# Patient Record
Sex: Female | Born: 1945 | Race: White | Hispanic: No | Marital: Married | Smoking: Never smoker
Health system: Southern US, Community
[De-identification: ages and names within clinical notes are randomized; demographics above are authoritative.]

## PROBLEM LIST (undated history)

## (undated) DIAGNOSIS — E539 Vitamin B deficiency, unspecified: Secondary | ICD-10-CM

## (undated) DIAGNOSIS — F5101 Primary insomnia: Secondary | ICD-10-CM

## (undated) DIAGNOSIS — M1990 Unspecified osteoarthritis, unspecified site: Secondary | ICD-10-CM

## (undated) DIAGNOSIS — D509 Iron deficiency anemia, unspecified: Secondary | ICD-10-CM

## (undated) DIAGNOSIS — R001 Bradycardia, unspecified: Secondary | ICD-10-CM

## (undated) DIAGNOSIS — F419 Anxiety disorder, unspecified: Secondary | ICD-10-CM

## (undated) DIAGNOSIS — R413 Other amnesia: Secondary | ICD-10-CM

## (undated) DIAGNOSIS — E538 Deficiency of other specified B group vitamins: Secondary | ICD-10-CM

## (undated) DIAGNOSIS — IMO0002 Reserved for concepts with insufficient information to code with codable children: Secondary | ICD-10-CM

## (undated) DIAGNOSIS — E785 Hyperlipidemia, unspecified: Secondary | ICD-10-CM

## (undated) DIAGNOSIS — R002 Palpitations: Secondary | ICD-10-CM

## (undated) DIAGNOSIS — I10 Essential (primary) hypertension: Secondary | ICD-10-CM

## (undated) DIAGNOSIS — R5383 Other fatigue: Secondary | ICD-10-CM

## (undated) DIAGNOSIS — N959 Unspecified menopausal and perimenopausal disorder: Secondary | ICD-10-CM

## (undated) DIAGNOSIS — E782 Mixed hyperlipidemia: Principal | ICD-10-CM

## (undated) DIAGNOSIS — J310 Chronic rhinitis: Secondary | ICD-10-CM

## (undated) DIAGNOSIS — M150 Primary generalized (osteo)arthritis: Secondary | ICD-10-CM

## (undated) DIAGNOSIS — F339 Major depressive disorder, recurrent, unspecified: Secondary | ICD-10-CM

## (undated) DIAGNOSIS — K279 Peptic ulcer, site unspecified, unspecified as acute or chronic, without hemorrhage or perforation: Secondary | ICD-10-CM

## (undated) HISTORY — DX: Bradycardia, unspecified: R00.1

## (undated) HISTORY — PX: KNEE SURGERY: SHX244

## (undated) HISTORY — DX: Major depressive disorder, recurrent, unspecified: F33.9

## (undated) HISTORY — DX: Anxiety disorder, unspecified: F41.9

## (undated) HISTORY — PX: BACK SURGERY: SHX140

## (undated) HISTORY — DX: Primary insomnia: F51.01

## (undated) HISTORY — DX: Essential (primary) hypertension: I10

## (undated) HISTORY — DX: Primary generalized (osteo)arthritis: M15.0

## (undated) HISTORY — DX: Other amnesia: R41.3

## (undated) HISTORY — PX: REPLACEMENT TOTAL KNEE: SUR1224

## (undated) HISTORY — DX: Iron deficiency anemia, unspecified: D50.9

## (undated) HISTORY — DX: Reserved for concepts with insufficient information to code with codable children: IMO0002

## (undated) HISTORY — DX: Chronic rhinitis: J31.0

## (undated) HISTORY — DX: Palpitations: R00.2

## (undated) HISTORY — DX: Unspecified osteoarthritis, unspecified site: M19.90

## (undated) HISTORY — DX: Deficiency of other specified B group vitamins: E53.8

## (undated) HISTORY — DX: Unspecified menopausal and perimenopausal disorder: N95.9

## (undated) HISTORY — DX: Mixed hyperlipidemia: E78.2

## (undated) HISTORY — DX: Vitamin B deficiency, unspecified: E53.9

## (undated) HISTORY — PX: CHOLECYSTECTOMY: SHX55

## (undated) HISTORY — DX: Hyperlipidemia, unspecified: E78.5

## (undated) HISTORY — DX: Peptic ulcer, site unspecified, unspecified as acute or chronic, without hemorrhage or perforation: K27.9

## (undated) HISTORY — DX: Other fatigue: R53.83

---

## 1997-12-14 ENCOUNTER — Other Ambulatory Visit: Admission: RE | Admit: 1997-12-14 | Discharge: 1997-12-14 | Payer: Self-pay | Admitting: Obstetrics and Gynecology

## 2007-10-31 ENCOUNTER — Ambulatory Visit (HOSPITAL_BASED_OUTPATIENT_CLINIC_OR_DEPARTMENT_OTHER): Admission: RE | Admit: 2007-10-31 | Discharge: 2007-10-31 | Payer: Self-pay | Admitting: Orthopedic Surgery

## 2008-01-06 ENCOUNTER — Ambulatory Visit (HOSPITAL_COMMUNITY): Admission: RE | Admit: 2008-01-06 | Discharge: 2008-01-06 | Payer: Self-pay | Admitting: Orthopedic Surgery

## 2008-01-10 ENCOUNTER — Emergency Department (HOSPITAL_COMMUNITY): Admission: EM | Admit: 2008-01-10 | Discharge: 2008-01-10 | Payer: Self-pay | Admitting: Emergency Medicine

## 2008-01-12 ENCOUNTER — Inpatient Hospital Stay (HOSPITAL_COMMUNITY): Admission: EM | Admit: 2008-01-12 | Discharge: 2008-01-13 | Payer: Self-pay | Admitting: Emergency Medicine

## 2009-10-27 IMAGING — CR DG LUMBAR SPINE 2-3V
1 series · 1 of 1 positions shown · non-contrast
Comparison: Lumbar spine MRI 01/06/2008

CLINICAL DATA: Lumbar disc disease.  L5-S1 disc herniation

LUMBAR SPINE - 2-3 VIEW

[view not recorded]
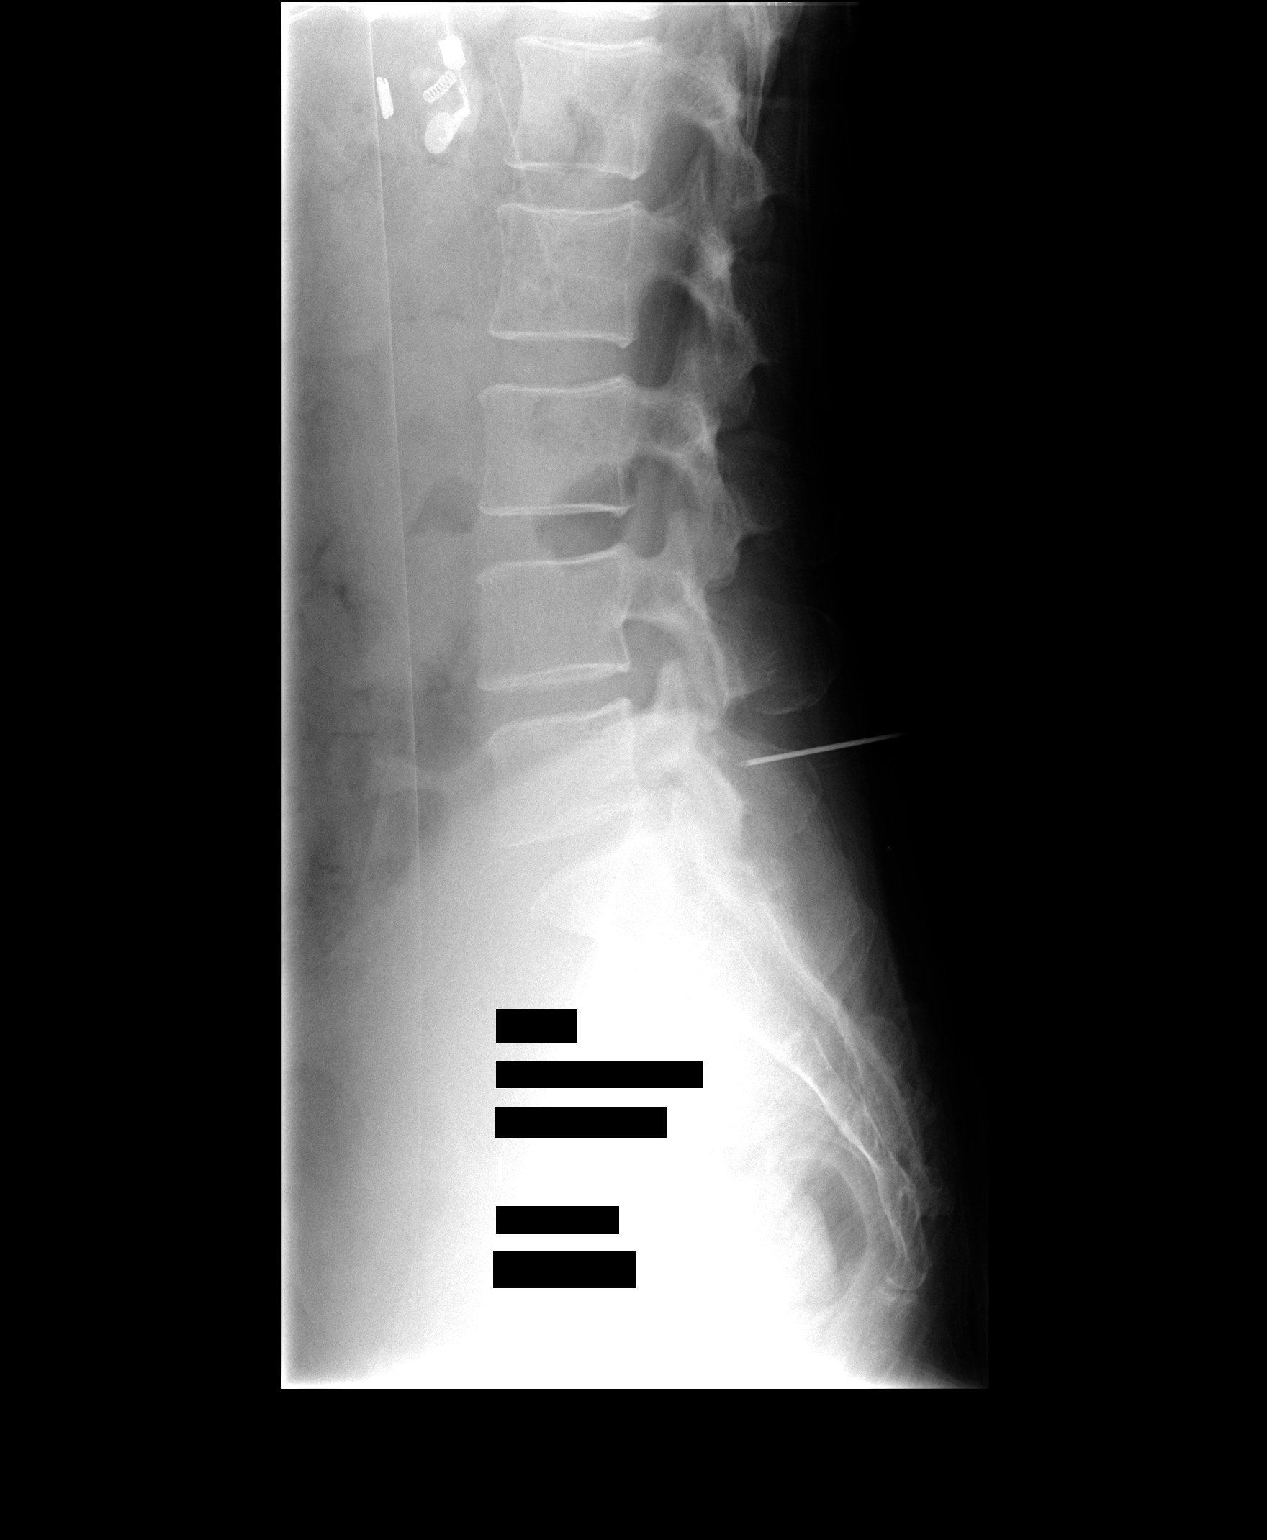

[1 of 1 positions shown; findings below may reference images not displayed]

FINDINGS: The first film from the operating room demonstrates a
spinal needle projecting at the L5-S1 disc space.  The second film
demonstrates retractors and a surgical instrument marking the L5-S1
disc space.
IMPRESSION: 1.  L5-S1 marked intraoperatively.

## 2011-01-06 NOTE — Op Note (Signed)
NAME:  Monique Lane, Monique Lane NO.:  [id number]   MEDICAL RECORD NO.:  [id number]          PATIENT TYPE:  INP   LOCATION:  3005                         FACILITY:  [facility]   PHYSICIAN:  Hewitt Shorts, M.D.DATE OF BIRTH:  1945-12-23   DATE OF PROCEDURE:  01/12/2008  DATE OF DISCHARGE:                               OPERATIVE REPORT   PREOPERATIVE DIAGNOSES:  1. Left L5-S1 lumbar disk herniation.  2. Lumbar degenerative disk disease.  3. Lumbar spondylosis.  4. Lumbar radiculopathy.   POSTOPERATIVE DIAGNOSES:  1. Left L5-S1 lumbar disk herniation.  2. Lumbar degenerative disk disease.  3. Lumbar spondylosis.  4. Lumbar radiculopathy.   PROCEDURE:  Left L5-S1 lumbar laminotomy and microdiskectomy with  microdissection.   SURGEON:  Hewitt Shorts, MD.   ASSISTANT:  Nelia Shi. Webb Silversmith, RNP.   ANESTHESIA:  General endotracheal.   INDICATIONS FOR PROCEDURE:  The patient is a 65 year old woman who  presented with left lumbar radiculopathy.  MRI scan revealed left L4-S1  lumbar disk herniation.  Decision was made to proceed for elective  laminotomy and microdiskectomy.   PROCEDURE:  The patient was brought to the operating room and placed  under general endotracheal anesthesia.  The patient was turned to a  prone position.  Lumbar region was prepped with Betadine soap and  solution and draped in a sterile fashion.  The midline was infiltrated  with local anesthetic with epinephrine and x-ray was taken.  The L5-S1  level was identified and a midline incision was made carried down  through the subcutaneous tissue.  Bipolar electrocautery was used to  maintain hemostasis.  Dissection was carried down to the lumbar fascia,  which was incised on the left side of the midline.  The paraspinal  muscles were dissected from the spinous process and lamina in a  subperiosteal fashion.  A self-retaining retractor was placed in the L5-  S1 intralaminar space was identified.  An  x-ray was taken to confirm  localization and then we proceed with a laminotomy using the X-Max drill  and Kerrison punches.  The ligamentum flavum was carefully removed, and  we exposed the thecal sac and left S1 nerve root.  We identified the  annulus of the L5-S1 disks and the S1 nerve root was pushed laterally,  and we found in the space between the S1 nerve root and the thecal sac  and large free fragment of disk herniation.  This was carefully  dissected from the surrounding epidural tissues and removed as large  fragments.  We then gently retracted the thecal sac and nerve root  medially, and we are able to expose the annulus.  There was a rent in  the inferior aspect of the annulus and residual subligamentous disk  herniation.  Therefore, we incised annulus and continued with thorough  discectomy at the L5-S1 level using a variety of microcurettes and  pituitary rongeurs.  All loose fragments of the disc material were  removed from both the disc space and the epidural space and good  decompression of the thecal sac and nerve root was  achieved.  Once the  decompression was completed and discectomy completed, hemostasis was  established with the use bipolar cautery as well as Gelfoam substance  thrombin, and then we proceeded with closure.  Prior to closure, 2 mL of  fentanyl and 80 mg of Depo-Medrol were instilled into the epidural  space.  The deep fascia was closed with interrupted undyed #1 Vicryl  sutures.  The subcutaneous and subcuticular were closed with inverted 2-  0 undyed Vicryl sutures.  The skin was reapproximated with Dermabond.  The procedure was tolerated well.  The estimated blood loss was 25 mL.  Sponge and needle count were correct.  Following surgery, the patient  was returned back to the supine position, to be reversed from the  anesthetic, extubated, and transferred to the recovery room for further  care.      Hewitt Shorts, M.D.  Electronically  Signed     RWN/MEDQ  D:  01/12/2008  T:  01/13/2008  Job:  [id number]

## 2011-01-06 NOTE — Op Note (Signed)
NAMEMarland Kitchen  KELCEY, Monique NO.:  [id number]   MEDICAL RECORD NO.:  [id number]          PATIENT TYPE:  AMB   LOCATION:  DSC                          FACILITY:  [facility]   PHYSICIAN:  Robert A. Thurston Hole, M.D. DATE OF BIRTH:  [birth year]   DATE OF PROCEDURE:  10/31/2007  DATE OF DISCHARGE:                               OPERATIVE REPORT   PREOPERATIVE DIAGNOSES:  1. 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  2. Right knee lateral patellar tracking.   POSTOPERATIVE DIAGNOSES:  1. 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  2. Right knee lateral patellar tracking.   PROCEDURE:  1. Right knee EUA followed by arthroscopic partial medial and lateral      meniscectomies.  2. Right knee chondroplasty with partial synovectomy.  3. Right knee lateral release.   SURGEON:  Dr. Salvatore Marvel   ANESTHESIA:  Local and MAC.   OPERATIVE TIME:  30 minutes.   COMPLICATIONS:  None.   INDICATIONS FOR PROCEDURE:  Monique Lane is a 65 year old woman who had  previously undergone right knee arthroscopy in September 2008, has had  significant persistent problems over the last 3 months, as she has tried  to return to work with recurrent pain and swelling.  Repeat MRI has  revealed a recurrent meniscal tear with chondromalacia, synovitis, and  possible lateral patellar tracking from scarring.  She has failed  conservative care and is now to undergo arthroscopy.   DESCRIPTION:  Monique Lane was brought to operating room on October 31, 2007,  after a knee block was placed in holding by anesthesia.  She was placed  on the operative table in a supine position.  Her right knee was  examined under anesthesia.  She had full range of motion.  Her knee was  stable.  Ligamentous exam with slight lateral patellar tracking.  She  received Ancef 1 g IV preoperatively for prophylaxis.  Initially,  through an anterolateral portal,  the arthroscope with a pump attached  was placed in through an anteromedial portal and arthroscopic probe was  placed after the knee was prepped using sterile Betadine and draped  using sterile technique.  On the initial inspection of the medial  compartment, she had 30-40% grade 3 chondromalacia which was debrided.  The medial meniscus which had previously undergone a partial  meniscectomy had further tearing of the posterior medial horn, and  another 30-40% was resected back to a stable rim leaving 50% of the  posterior medial horn intact. Intercondylar notch inspected.  Anterior  and posterior cruciate ligaments were normal.  Lateral compartment  showed 25-30% grade 3 chondromalacia which was debrided.  Lateral  meniscus also had a recurrent tear of the anterior and lateral horn of  which 30-40% was resected leaving 50% intact.  The posterior horn was  intact.  Popliteus tendon was intact.  Patellofemoral joint showed 25%  grade 3 chondromalacia which was debrided.  There was slight lateral  patella tilt and tracking.  A  lateral retinacular release was carried  out with an ArthroCare wand with no excessive bleeding.  This  significantly improved patellar tracking and decompressed the  patellofemoral joint.  Moderate synovitis and anterior scarred synovium  then the medial and lateral gutters and anterior compartment was  thoroughly debrided and cauterized as well.  At this point, it was felt  that all pathology had been satisfactorily addressed.  The instruments  were removed.  Portals were closed with 3-0 nylon suture and injected  with 0.25% Marcaine with epinephrine and 4 mg of morphine.  Sterile  dressings were applied and the patient awakened and taken to recovery in  stable condition.   FOLLOW UP CARE:  Monique Lane will be followed as a outpatient on Percocet  and Mobic.  See her back in the office in a week for sutures out and  followup.      Robert A. Thurston Hole, M.D.   Electronically Signed     RAW/MEDQ  D:  10/31/2007  T:  11/01/2007  Job:  [id number]

## 2011-01-06 NOTE — H&P (Signed)
NAME:  Monique Lane, Monique Lane NO.:  [id number]   MEDICAL RECORD NO.:  [id number]          PATIENT TYPE:  INP   LOCATION:  3005                         FACILITY:  [facility]   PHYSICIAN:  Hewitt Shorts, M.D.DATE OF BIRTH:  Jul 06, 1946   DATE OF ADMISSION:  01/12/2008  DATE OF DISCHARGE:                              HISTORY & PHYSICAL   HISTORY OF PRESENT ILLNESS:  The patient is a 65 year old right-handed  white female whom we are asked to see in Neurosurgery consultation in  the [facility] emergency room by Dr. Devoria Albe.  The patient  underwent right knee arthroscopy in September 2008 and March 2009 with  Dr. Salvatore Marvel.  She has undergone physical therapy subsequently and  with this therapy developed increasing pain in her low back extending  down into the left buttock, posterior thigh, and calf to the left heel  with numbness and tingling from the buttock to the spine, leg, and into  the foot.  She describes cramping in the musculature of the left lower  extremity.  Dr. Thurston Hole sent her for a MRI scan, 6 days ago and consulted  Dr. Marikay Alar and saw the patient in neurosurgery consultation.  Since  that time, she has had 3 emergency room visits at [facility]  and one emergency room visit at [facility]  because  of the severity of her left lumbar radicular pain.   The patient had been offered surgery by Dr. Yetta Barre, but she declined it,  however as she tried to manage this with pain medications, muscle  relaxants, and steroids at home.  She developed confusion and  hallucinations.  The patient's family stopped her pain medication, but  she presents to the emergency room today requesting further evaluation  and care.   The patient requested that Dr. Lynelle Doctor obtain a second opinion  neurosurgical consultation and Dr. Lynelle Doctor contacted [place] for such.  MRI  scan from 6 days ago shows multilevel degenerative disk disease,  spondylosis to  the lumbar spine with spine like disk protrusion in the  lateral recess and neuroforamen and extraforaminal space to the right-  sided S4 and L4-L5 that appears spondylitic.  However, the most acute  finding appears to be a moderately large left L5-S1 lumbar disk  herniation.  It was felt that this is a symptomatic lesion.   PAST MEDICAL HISTORY:  Notable for history of bradycardia, which has  been monitored, but has not required treatment.  She does not describe  any history of hypertension, myocardial infarction, cancer, stroke,  diabetes, peptic ulcers, or lung disease.   PAST SURGICAL HISTORY:  Previous surgery with hysterectomy 20 years ago,  appendectomy when she was 65 years old, cholecystectomy over 10 years  ago, hemorrhoidectomy over 15 years ago and her 2 right knee  arthroscopies as described above.   ALLERGIES:  She reports an allergy to Demerol.   MEDICATIONS:  Alesse, fish oil and recently p.o. morphine, prednisone  Dosepak and Valium.   FAMILY HISTORY:  Parents have passed out and mother had a myocardial  infarction and father had liver cancer.   SOCIAL HISTORY:  The patient is married.  She works at [employer].  She has 1 daughter who is alive and well.  She does not  smoke, drink alcoholic beverages or have history of substance abuse.   REVIEW OF SYSTEMS:  Notable for as described in the  history of present  illness, past medical history, but a 14-point review of system is  otherwise remarkable.   PHYSICAL EXAMINATION:  GENERAL:  The patient is a well-developed and  well-nourished white female in no acute distress.  VITAL SIGNS:  Temperature 96.6, pulse 59, blood pressure 106/58,  respiratory rate 20.  LUNGS:  Clear to auscultation.  She has symmetrical respiratory  excursion.  HEART:  Regular rate and rhythm.  Normal S1 and S2.  There is no murmur.  EXTREMITIES:  No clubbing, cyanosis, or edema.  MUSCULOSKELETAL:  Positive straight leg raising on  the left.  Negative  straight leg raising on the right.  NEUROLOGICAL:  Shows 5/5 strength to the upper and lower extremities  including the deltoids, biceps, triceps, intrinsic grip, iliopsoas,  quadriceps, dorsiflexion, extensor as well as plantar flexion  bilaterally.  Sensation is intact to pinprick to the upper and lower  extremities.  Reflexes are minimal to biceps, brachialis, triceps,  quadriceps, and gastrocnemius and symmetrical bilaterally.  Toes are  downgoing bilaterally.  She has a normal gait and stance.   DIAGNOSIS:  MRI of the lumbar spine done on Jan 06, 2008, shows  disk  herniation to the right within the lateral recess and neuroforaminal and  extraforaminal space at L3-L4 and L4-L5.  However, the most significant  finding is of an acute left L5-S1 lumbar disk herniation.  The disk  fragment has migrated slightly probably behind the body of S1.   IMPRESSION:  Acute left lumbar radiculopathy, which unfortunately has  not responded to rest  and medication over the past several days.  The  patient is neurologically intact and has not tolerated medications well  with some confusion and hallucinations with the medications.   PLAN:  The patient readmitted at her request and will be taken to  surgery for a left L4-S1 lumbar laminectomy and microdiskectomy.  We  have offered to have her follow up with Dr. Marikay Alar, who had  initially assessed and treated her however, she asked that we assume her  care.   I discussed the nature of the surgery, procedure, and type of the  surgery, hospital stay, and  occupational limitations postoperatively,  and risks that include risks of infection, bleeding, possibility of  transfusion, of the risk of nerve root dysfunction, pain, weakness,  numbness, or paresthesias, the risks of dural tear, CSF leakage,  possible need for further surgery, risk of recurrence of disk  herniation,  possible for further surgery, anesthetic risks of   myocardial infarction, stroke, and death.  After discussing all of this,  she would like to go ahead with surgery and is admitted for such.      Hewitt Shorts, M.D.  Electronically Signed     RWN/MEDQ  D:  01/12/2008  T:  01/13/2008  Job:  [id number]

## 2011-05-18 LAB — POCT HEMOGLOBIN-HEMACUE: Hemoglobin: 13.4

## 2011-05-20 LAB — COMPREHENSIVE METABOLIC PANEL
ALT: 83 — ABNORMAL HIGH
AST: 36
Albumin: 3.3 — ABNORMAL LOW
Alkaline Phosphatase: 119 — ABNORMAL HIGH
BUN: 22
CO2: 27
Calcium: 9.2
Chloride: 103
Creatinine, Ser: 0.75
GFR calc Af Amer: >60
GFR calc non Af Amer: >60
Glucose, Bld: 68 — ABNORMAL LOW
Potassium: 4.2
Sodium: 138
Total Bilirubin: 1.9 — ABNORMAL HIGH
Total Protein: 6.2

## 2011-05-20 LAB — CBC
HCT: 42
Hemoglobin: 14
Hemoglobin: 14.2
MCHC: 33.8
MCHC: 34.2
MCV: 90.3
Platelets: 268
Platelets: 311
RBC: 4.65
RDW: 12.8
RDW: 13.4
WBC: 9.5

## 2011-05-20 LAB — DIFFERENTIAL
Basophils Absolute: 0
Basophils Absolute: 0
Basophils Relative: 0
Basophils Relative: 0
Eosinophils Absolute: 0.1
Eosinophils Relative: 0
Eosinophils Relative: 1
Lymphocytes Relative: 36
Lymphs Abs: 3.4
Monocytes Absolute: 0.2
Monocytes Absolute: 1.2 — ABNORMAL HIGH
Monocytes Relative: 12
Neutro Abs: 4.8
Neutro Abs: 8.5 — ABNORMAL HIGH
Neutrophils Relative %: 50

## 2011-05-20 LAB — BASIC METABOLIC PANEL
BUN: 18
CO2: 28
Calcium: 9
Glucose, Bld: 139 — ABNORMAL HIGH
Sodium: 137

## 2013-10-03 ENCOUNTER — Encounter: Payer: Self-pay | Admitting: Podiatrist

## 2013-10-03 ENCOUNTER — Ambulatory Visit (INDEPENDENT_AMBULATORY_CARE_PROVIDER_SITE_OTHER): Payer: Medicare Other | Admitting: Podiatrist

## 2013-10-03 ENCOUNTER — Ambulatory Visit (INDEPENDENT_AMBULATORY_CARE_PROVIDER_SITE_OTHER): Payer: Medicare Other

## 2013-10-03 DIAGNOSIS — M79609 Pain in unspecified limb: Secondary | ICD-10-CM

## 2013-10-03 DIAGNOSIS — M722 Plantar fascial fibromatosis: Secondary | ICD-10-CM

## 2013-10-03 MED ORDER — TRIAMCINOLONE ACETONIDE 40 MG/ML IJ SUSP
20.0000 mg | Freq: Once | INTRAMUSCULAR | Status: AC
  Administered 2013-10-03: 20 mg

## 2013-10-03 NOTE — Progress Notes (Signed)
   Subjective:    Patient ID: Monique Lane, female    DOB: [birth year], 68 y.o.   MRN: [id number]  HPI patient presents for left heel pain stating " I called three weeks ago to make an appt to see about my left heel on the bottom and used excedrin cream and I used it every night with a sock at night and it feels better and sore and tender and doesn't hurt too bad today"  She states it has improved with conservative care however it is still a little sensitive to touch and stand.      Review of Systems  Gastrointestinal:       Bleeding ulcers  All other systems reviewed and are negative.       Objective:   Physical Exam GENERAL APPEARANCE: Alert, conversant. Appropriately groomed. No acute distress.  VASCULAR: Pedal pulses palpable at 2/4 DP and PT bilateral.  Capillary refill time is immediate to all digits,  Proximal to distal cooling it warm to warm.  Digital hair growth is present bilateral  NEUROLOGIC: sensation is intact epicritically and protectively to 5.07 monofilament at 5/5 sites bilateral.  Light touch is intact bilateral, vibratory sensation intact bilateral, achilles tendon reflex is intact bilateral.  MUSCULOSKELETAL: pain on palpation with palpable inflammation at the insertion of the plantar fascia on the medial calcaneal tubercle  Left.  Plantar fascia is intact.  Negative tinel sign noted. DERMATOLOGIC: skin color, texture, and turger are within normal limits.  No preulcerative lesions are seen, no interdigital maceration noted.  No open lesions present.  Digital nails are asymptomatic.     Assessment & Plan:  Plantar fasciitis/ bursitis left Plan:  Discussed etiology, pathology, conservative vs. Surgical therapies and at this time a plantar fascial injection was recommended.  The patient agreed and a sterile skin prep was applied.  An injection consisting of kenalog and marcaine mixture was infiltrated at the point of maximal tenderness on the left Heel.  The patient tolerated  this well and was given instructions for aftercare. A  plantar fascia taping was applied.  She will call if the symptoms do not fully resolve with the injection.  May be a candidate for custom orthotics in the future.

## 2013-12-21 ENCOUNTER — Ambulatory Visit (INDEPENDENT_AMBULATORY_CARE_PROVIDER_SITE_OTHER): Payer: Medicare Other

## 2013-12-21 ENCOUNTER — Encounter: Payer: Self-pay | Admitting: *Deleted

## 2013-12-21 VITALS — BP 144/76 | HR 52 | Resp 18

## 2013-12-21 DIAGNOSIS — M722 Plantar fascial fibromatosis: Secondary | ICD-10-CM

## 2013-12-21 DIAGNOSIS — M79609 Pain in unspecified limb: Secondary | ICD-10-CM

## 2013-12-21 NOTE — Progress Notes (Signed)
   Subjective:    Patient ID: Monique Lane, female    DOB: 01-17-1946, 68 y.o.   MRN: [id number]  HPI my left heel is tender and I started working at [employer] and they told me that I had to get the shoes out of a book and it hurt on the 5th toe on right foot     Review of Systems  Hematological: Bruises/bleeds easily.  All other systems reviewed and are negative.      Objective:   Physical Exam Lower extremity objective findings as follows pedal pulses palpable DP and PT posterior were for bilateral capillary refill time 3 seconds all digits epicritic and proprioceptive sensations intact bilateral patient does have rectus foot type some prominence of the fifth metatarsal and fifth toe was aggravated by the end curved last shoe that she was being made aware at this time easily wider straight lash shoes that she currently has available to her will maintain a none slip shoe Oxford type shoe a wider design accommodates her foot. The shoe also accommodate a functional orthoses which patient will be getting in the future. Patient does have pain on palpation mid band of the plantar fascial left medial calcaneal tubercle area no signs of fracture noted no other osseous abnormalities recalcitrant plantar fasciitis is noted       Assessment & Plan:  Assessment capsulitis fifth MTP area right resolving with shoe change also at this time plantar fasciitis left foot fascial strapping applied today assess the benefits of functional orthoses in the future. Maintain a good stable Oxford type shoe or sit or sandal at all times no barefoot or flimsy shoes or flip-flops recommended Tylenol as needed for pain also use ice for the heel if needed next  [employer] Pacificichard Aivan Fillingim DPM

## 2013-12-21 NOTE — Patient Instructions (Signed)

## 2014-01-04 ENCOUNTER — Ambulatory Visit (INDEPENDENT_AMBULATORY_CARE_PROVIDER_SITE_OTHER): Payer: Medicare Other

## 2014-01-04 VITALS — BP 140/77 | HR 46 | Resp 18

## 2014-01-04 DIAGNOSIS — M79609 Pain in unspecified limb: Secondary | ICD-10-CM

## 2014-01-04 DIAGNOSIS — M722 Plantar fascial fibromatosis: Secondary | ICD-10-CM

## 2014-01-04 MED ORDER — TRIAMCINOLONE ACETONIDE 10 MG/ML IJ SUSP: 10.0000 mg | Freq: Once | INTRAMUSCULAR | Status: DC

## 2014-01-04 NOTE — Patient Instructions (Signed)

## 2014-01-04 NOTE — Progress Notes (Signed)
   Subjective:    Patient ID: Monique Lane, female    DOB: 1946/07/29, 68 y.o.   MRN: [id number]  HPI my left heel is still hurting but it is better than it was and I want him to give me a shot    Review of Systems no new systemic changes or findings noted    Objective:   Physical Exam Lower extremity objective findings intact and unchanged patient hasn't reduced are almost no pain in the arch area after the strapping however still some slight tenderness or pain in the inferior calcaneal tubercle there is requesting a steroid injection at this time. Steroid injection is delivered at this time to the inferior heel area at this time based affected the taping was beneficial orthotic would be beneficial dispensed a power step type orthotic for patient use with oral and written instructions .       Assessment & Plan:  Assessment plantar fasciitis/heel spur syndrome left foot responding to the strapping power step orthotics dispensed again form is reviewed also at this time for root reappointed in one to 2 months for followup orthotic adjustments if needed patient is also given injection per patient request of tender with Kenalog 20 mg Xylocaine plain to the inferior left heel maintain ice as needed and reappointed one to 2 months for adjustments to orthotics.  Alvan Dameichard Alayia Meggison DPM

## 2014-08-31 ENCOUNTER — Ambulatory Visit (INDEPENDENT_AMBULATORY_CARE_PROVIDER_SITE_OTHER): Payer: Medicare Other

## 2014-08-31 VITALS — BP 132/75 | HR 58 | Resp 18

## 2014-08-31 DIAGNOSIS — M79672 Pain in left foot: Secondary | ICD-10-CM

## 2014-08-31 DIAGNOSIS — M722 Plantar fascial fibromatosis: Secondary | ICD-10-CM | POA: Diagnosis not present

## 2014-08-31 MED ORDER — TRIAMCINOLONE ACETONIDE 10 MG/ML IJ SUSP: 10.0000 mg | Freq: Once | INTRAMUSCULAR | Status: DC

## 2014-08-31 NOTE — Patient Instructions (Signed)
Bring orthotics at next visit for possible adjustment and reevaluation   WEARING INSTRUCTIONS FOR ORTHOTICS  Don't expect to be comfortable wearing your orthotic devices for the first time.  Like eyeglasses, you may be aware of them as time passes, they will not be uncomfortable and you will enjoy wearing them.  FOLLOW THESE INSTRUCTIONS EXACTLY!  1. Wear your orthotic devices for:       Not more than 1 hour the first day.       Not more than 2 hours the second day.       Not more than 3 hours the third day and so on.        Or wear them for as long as they feel comfortable.       If you experience discomfort in your feet or legs take them out.  When feet & legs feel       better, put them back in.  You do need to be consistent and wear them a little        everyday. 2.   If at any time the orthotic devices become acutely uncomfortable before the       time for that particular day, STOP WEARING THEM. 3.   On the next day, do not increase the wearing time. 4.   Subsequently, increase the wearing time by 15-30 minutes only if comfortable to do       so. 5.   You will be seen by your doctor about 2-4 weeks after you receive your orthotic       devices, at which time you will probably be wearing your devices comfortably        for about 8 hours or more a day. 6.   Some patients occasionally report mild aches or discomfort in other parts of the of       body such as the knees, hips or back after 3 or 4 consecutive hours of wear.  If this       is the case with you, do not extend your wearing time.  Instead, cut it back an hour or       two.  In all likelihood, these symptoms will disappear in a short period of time as your       body posture realigns itself and functions more efficiently. 7.   It is possible that your orthotic device may require some small changes or adjustment       to improve their function or make them more comfortable.   This is usually not done       before one to  three months have elapsed.  These adjustments are made in        accordance with the changed position your feet are assuming as a result of       improved biomechanical function. 8.   In women's shoes, it's not unusual for your heel to slip out of the shoe, particularly if       they are step-in-shoes.  If this is the case, try other shoes or other styles.  Try to       purchase shoes which have deeper heal seats or higher heel counters. 9.   Squeaking of orthotics devices in the shoes is due to the movement of the devices       when they are functioning normally.  To eliminate squeaking, simply dust some       baby powder into your shoes before inserting  the devices.  If this does not work,        apply soap or wax to the edges of the orthotic devices or put a tissue into the shoes. 10. It is important that you follow these directions explicitly.  Failure to do so will simply       prolong the adjustment period or create problems which are easily avoided.  It makes       no difference if you are wearing your orthotic devices for only a few hours after        several months, so long as you are wearing them comfortably for those hours. 11. If you have any questions or complaints, contact our office.  We have no way of       knowing about your problems unless you tell us.  If we do not hear from you, we will       assume that you are proceeding well.     ICE INSTRUCTIONS  Apply ice or cold pack to the affected area at least 3 times a day for 10-15 minutes each time.  You should also use ice after prolonged activity or vigorous exercise.  Do not apply ice longer than 20 minutes at one time.  Always keep a cloth between your skin and the ice pack to prevent burns.  Being consistent and following these instructions will help control your symptoms.  We suggest you purchase a gel ice pack because they are reusable and do bit leak.  Some of them are designed to wrap around the area.  Use the method  that works best for you.  Here are some other suggestions for icing.   Use a frozen bag of peas or corn-inexpensive and molds well to your body, usually stays frozen for 10 to 20 minutes.  Wet a towel with cold water and squeeze out the excess until it's damp.  Place in a bag in the freezer for 20 minutes. Then remove and use.

## 2014-08-31 NOTE — Progress Notes (Signed)
   Subjective:    Patient ID: Monique Lane, female    DOB: June 01, 1946, 69 y.o.   MRN: [id number]  HPI  I HAVE SOME LEFT HEEL PAIN AND THAT HAS BEEN GOING ON FOR ABOUT A WEEK NOW AND IS TENDER AND HURTS IN THE MORNING AND HURTS ON THE BOTTOM OF MY LEFT HEEL  Review of Systems no new findings or systemic changes noted     Objective:   Physical Exam Neurovascular status is intact and unchanged patient's had a recurrence of her heel pain for the past week or so isn't wearing orthotics as consistent with C7 will bring those in at future visits for follow-up and adjustments may be more beneficial with padded top covering. Patient continues to have pain on palpation medial band plantar fascia medial calcaneal tubercle no history of injury or trauma no other changes noted on palpation is identified orthopedic biomechanical exam otherwise unremarkable rectus foot type ankle metatarsal subtalar joint motions normal no retrocalcaneal pain no Achilles pain. No open wounds no ulcers.       Assessment & Plan:  Assessment plantar fasciitis/heel spur re-exacerbation left foot plan at this time fascial strapping applied to the left foot after an injection tender with Kenalog 20 10 Xylocaine plain is get delivered to the medial band medial calcaneal tubercle area of the left heel. However the injection well will also continue with ice to the area as instructed maintain orthoses as instructed may bring in orthotics for adjustments. Next  Alvan Dameichard Dealie Koelzer DPM

## 2014-09-14 ENCOUNTER — Ambulatory Visit: Payer: Medicare Other

## 2015-04-18 DIAGNOSIS — F419 Anxiety disorder, unspecified: Secondary | ICD-10-CM

## 2015-04-18 DIAGNOSIS — I10 Essential (primary) hypertension: Secondary | ICD-10-CM

## 2015-04-18 DIAGNOSIS — R001 Bradycardia, unspecified: Secondary | ICD-10-CM

## 2015-04-18 DIAGNOSIS — E785 Hyperlipidemia, unspecified: Secondary | ICD-10-CM

## 2015-04-18 HISTORY — DX: Essential (primary) hypertension: I10

## 2015-04-18 HISTORY — DX: Bradycardia, unspecified: R00.1

## 2015-04-18 HISTORY — DX: Anxiety disorder, unspecified: F41.9

## 2015-04-18 HISTORY — DX: Hyperlipidemia, unspecified: E78.5

## 2015-09-04 DIAGNOSIS — D509 Iron deficiency anemia, unspecified: Secondary | ICD-10-CM

## 2015-09-04 DIAGNOSIS — R5383 Other fatigue: Secondary | ICD-10-CM | POA: Insufficient documentation

## 2015-09-04 DIAGNOSIS — E539 Vitamin B deficiency, unspecified: Secondary | ICD-10-CM

## 2015-09-04 DIAGNOSIS — N959 Unspecified menopausal and perimenopausal disorder: Secondary | ICD-10-CM

## 2015-09-04 DIAGNOSIS — F339 Major depressive disorder, recurrent, unspecified: Secondary | ICD-10-CM | POA: Insufficient documentation

## 2015-09-04 HISTORY — DX: Iron deficiency anemia, unspecified: D50.9

## 2015-09-04 HISTORY — DX: Other fatigue: R53.83

## 2015-09-04 HISTORY — DX: Vitamin B deficiency, unspecified: E53.9

## 2015-09-04 HISTORY — DX: Unspecified menopausal and perimenopausal disorder: N95.9

## 2015-09-04 HISTORY — DX: Major depressive disorder, recurrent, unspecified: F33.9

## 2015-09-24 DIAGNOSIS — E782 Mixed hyperlipidemia: Secondary | ICD-10-CM | POA: Insufficient documentation

## 2015-09-24 DIAGNOSIS — J310 Chronic rhinitis: Secondary | ICD-10-CM

## 2015-09-24 DIAGNOSIS — E538 Deficiency of other specified B group vitamins: Secondary | ICD-10-CM | POA: Insufficient documentation

## 2015-09-24 HISTORY — DX: Chronic rhinitis: J31.0

## 2015-09-24 HISTORY — DX: Mixed hyperlipidemia: E78.2

## 2015-09-24 HISTORY — DX: Deficiency of other specified B group vitamins: E53.8

## 2015-10-25 DIAGNOSIS — K279 Peptic ulcer, site unspecified, unspecified as acute or chronic, without hemorrhage or perforation: Secondary | ICD-10-CM | POA: Insufficient documentation

## 2015-10-25 DIAGNOSIS — Z8711 Personal history of peptic ulcer disease: Secondary | ICD-10-CM | POA: Insufficient documentation

## 2015-10-25 DIAGNOSIS — R002 Palpitations: Secondary | ICD-10-CM

## 2015-10-25 DIAGNOSIS — F5101 Primary insomnia: Secondary | ICD-10-CM

## 2015-10-25 HISTORY — DX: Primary insomnia: F51.01

## 2015-10-25 HISTORY — DX: Palpitations: R00.2

## 2015-10-25 HISTORY — DX: Peptic ulcer, site unspecified, unspecified as acute or chronic, without hemorrhage or perforation: K27.9

## 2016-03-03 DIAGNOSIS — M159 Polyosteoarthritis, unspecified: Secondary | ICD-10-CM

## 2016-03-03 DIAGNOSIS — M150 Primary generalized (osteo)arthritis: Secondary | ICD-10-CM

## 2016-03-03 DIAGNOSIS — M8949 Other hypertrophic osteoarthropathy, multiple sites: Secondary | ICD-10-CM

## 2016-03-03 HISTORY — DX: Polyosteoarthritis, unspecified: M15.9

## 2016-03-03 HISTORY — DX: Other hypertrophic osteoarthropathy, multiple sites: M89.49

## 2016-03-16 DIAGNOSIS — R413 Other amnesia: Secondary | ICD-10-CM

## 2016-03-16 HISTORY — DX: Other amnesia: R41.3

## 2016-06-19 ENCOUNTER — Ambulatory Visit (INDEPENDENT_AMBULATORY_CARE_PROVIDER_SITE_OTHER): Payer: Medicare Other | Admitting: Sports Medicine

## 2016-06-19 DIAGNOSIS — M79672 Pain in left foot: Secondary | ICD-10-CM

## 2016-06-19 DIAGNOSIS — M722 Plantar fascial fibromatosis: Secondary | ICD-10-CM

## 2016-06-19 MED ORDER — IBUPROFEN 600 MG PO TABS: 600.0000 mg | ORAL_TABLET | Freq: Every day | ORAL | 0 refills | Status: DC

## 2016-06-19 NOTE — Progress Notes (Signed)
Subjective: Monique Lane is a 70 y.o. female patient presents to office with complaint of heel pain on the left. Patient admits to post static dyskinesia for 2-3 weeks in duration. Patient has treated this problem with soaking and changing shoes and massage with lotions and creams with some improvement. Admits to a past history of plantar fasciitis 1 year ago with similar symptoms. States that her pain is not bad like it was before. However, she felt a little discomfort and decided to come to have it evaluated before it got worse. Patient states that she does not want an injection. Denies any other pedal complaints.   There are no active problems to display for this patient.   Current Outpatient Prescriptions on File Prior to Visit  Medication Sig Dispense Refill  . azithromycin (ZITHROMAX) 250 MG tablet     . cefdinir (OMNICEF) 300 MG capsule     . loratadine (CLARITIN) 10 MG tablet Take 10 mg by mouth daily.  0  . omeprazole (PRILOSEC) 20 MG capsule     . pravastatin (PRAVACHOL) 40 MG tablet     . predniSONE (DELTASONE) 20 MG tablet Take 20 mg by mouth daily.  0  . simvastatin (ZOCOR) 20 MG tablet      Current Facility-Administered Medications on File Prior to Visit  Medication Dose Route Frequency Provider Last Rate Last Dose  . triamcinolone acetonide (KENALOG) 10 MG/ML injection 10 mg  10 mg Other Once Alvan Dame, DPM      . triamcinolone acetonide (KENALOG) 10 MG/ML injection 10 mg  10 mg Other Once Alvan Dame, DPM        Allergies  Allergen Reactions  . Demerol [Meperidine] Nausea And Vomiting and Rash    Objective: Physical Exam General: The patient is alert and oriented x3 in no acute distress.  Dermatology: Skin is warm, dry and supple bilateral lower extremities. Nails 1-10 are normal. There is no erythema, edema, no eccymosis, no open lesions present. Integument is otherwise unremarkable.  Vascular: Dorsalis Pedis pulse and Posterior Tibial pulse are 1/4 bilateral.  Capillary fill time is immediate to all digits.  Neurological: Grossly intact to light touch with an achilles reflex of +2/5 and a  negative Tinel's sign bilateral.  Musculoskeletal: No reproducible tenderness to palpation at the medial calcaneal tubercale and through the insertion of the plantar fascia on the left foot, however, occasional subjective discomfort. No pain with compression of calcaneus bilateral. No pain with tuning fork to calcaneus bilateral. No pain with calf compression bilateral. There is decreased Ankle joint range of motion bilateral. All other joints range of motion within normal limits bilateral. Strength 5/5 in all groups bilateral.   Assessment and Plan: Problem List Items Addressed This Visit    None    Visit Diagnoses    Plantar fasciitis of left foot    -  Primary   Relevant Medications   ibuprofen (ADVIL,MOTRIN) 600 MG tablet   Left foot pain       Relevant Medications   ibuprofen (ADVIL,MOTRIN) 600 MG tablet      -Complete examination performed.  -Previous Xrays reviewed -Discussed with patient in detail the condition of plantar fasciitis, how this occurs and general treatment options. Explained both conservative and surgical treatments.  -Patient declined injection -Rx Motrin -Recommended good supportive shoes and advised use of  custom molded orthoses of which she owns - Explained in detail the use of the fascial brace/left foot which was dispensed at today's visit. -Explained and dispensed to  patient daily stretching exercises. -Recommend patient to ice affected area 1-2x daily. -Patient to return to office in as needed for follow up or sooner if problems or questions arise.  Asencion Islamitorya Jakari Jacot, DPM

## 2016-06-19 NOTE — Patient Instructions (Signed)

## 2017-01-26 DIAGNOSIS — R001 Bradycardia, unspecified: Secondary | ICD-10-CM | POA: Diagnosis not present

## 2017-01-26 DIAGNOSIS — E785 Hyperlipidemia, unspecified: Secondary | ICD-10-CM | POA: Diagnosis not present

## 2017-01-26 DIAGNOSIS — K219 Gastro-esophageal reflux disease without esophagitis: Secondary | ICD-10-CM | POA: Diagnosis not present

## 2017-01-26 DIAGNOSIS — F0390 Unspecified dementia without behavioral disturbance: Secondary | ICD-10-CM | POA: Diagnosis not present

## 2017-01-26 DIAGNOSIS — I10 Essential (primary) hypertension: Secondary | ICD-10-CM | POA: Diagnosis not present

## 2017-01-26 DIAGNOSIS — M1991 Primary osteoarthritis, unspecified site: Secondary | ICD-10-CM | POA: Diagnosis not present

## 2017-01-26 DIAGNOSIS — R079 Chest pain, unspecified: Secondary | ICD-10-CM | POA: Diagnosis not present

## 2017-01-27 DIAGNOSIS — I10 Essential (primary) hypertension: Secondary | ICD-10-CM | POA: Diagnosis not present

## 2017-01-27 DIAGNOSIS — E785 Hyperlipidemia, unspecified: Secondary | ICD-10-CM | POA: Diagnosis not present

## 2017-01-27 DIAGNOSIS — M1991 Primary osteoarthritis, unspecified site: Secondary | ICD-10-CM | POA: Diagnosis not present

## 2017-01-27 DIAGNOSIS — R079 Chest pain, unspecified: Secondary | ICD-10-CM | POA: Diagnosis not present

## 2017-01-27 DIAGNOSIS — K219 Gastro-esophageal reflux disease without esophagitis: Secondary | ICD-10-CM | POA: Diagnosis not present

## 2017-01-27 DIAGNOSIS — F0390 Unspecified dementia without behavioral disturbance: Secondary | ICD-10-CM | POA: Diagnosis not present

## 2017-09-13 ENCOUNTER — Ambulatory Visit (INDEPENDENT_AMBULATORY_CARE_PROVIDER_SITE_OTHER): Payer: Medicare Other | Admitting: Cardiology

## 2017-09-13 ENCOUNTER — Encounter: Payer: Self-pay | Admitting: Cardiology

## 2017-09-13 VITALS — BP 140/80 | HR 71 | Ht 67.0 in | Wt 140.0 lb

## 2017-09-13 DIAGNOSIS — I10 Essential (primary) hypertension: Secondary | ICD-10-CM

## 2017-09-13 DIAGNOSIS — E782 Mixed hyperlipidemia: Secondary | ICD-10-CM | POA: Diagnosis not present

## 2017-09-13 DIAGNOSIS — R001 Bradycardia, unspecified: Secondary | ICD-10-CM | POA: Diagnosis not present

## 2017-09-13 NOTE — Patient Instructions (Addendum)
Medication Instructions:  Your physician recommends that you continue on your current medications as directed. Please refer to the Current Medication list given to you today.  Labwork: Your physician recommends that you have lab work today: Lipid panel  Testing/Procedures: EKG Today  Follow-Up: Your physician recommends that you schedule a follow-up appointment in: 6 months with Dr. Bing Matter in [place]  Any Other Special Instructions Will Be Listed Below (If Applicable).     If you need a refill on your cardiac medications before your next appointment, please call your pharmacy.   Cholesterol Cholesterol is a white, waxy, fat-like substance that is needed by the human body in small amounts. The liver makes all the cholesterol we need. Cholesterol is carried from the liver by the blood through the blood vessels. Deposits of cholesterol (plaques) may build up on blood vessel (artery) walls. Plaques make the arteries narrower and stiffer. Cholesterol plaques increase the risk for heart attack and stroke. You cannot feel your cholesterol level even if it is very high. The only way to know that it is high is to have a blood test. Once you know your cholesterol levels, you should keep a record of the test results. Work with your health care provider to keep your levels in the desired range. What do the results mean?  Total cholesterol is a rough measure of all the cholesterol in your blood.  LDL (low-density lipoprotein) is the "bad" cholesterol. This is the type that causes plaque to build up on the artery walls. You want this level to be low.  HDL (high-density lipoprotein) is the "good" cholesterol because it cleans the arteries and carries the LDL away. You want this level to be high.  Triglycerides are fat that the body can either burn for energy or store. High levels are closely linked to heart disease. What are the desired levels of cholesterol?  Total cholesterol below  200.  LDL below 100 for people who are at risk, below 70 for people at very high risk.  HDL above 40 is good. A level of 60 or higher is considered to be protective against heart disease.  Triglycerides below 150. How can I lower my cholesterol? Diet Follow your diet program as told by your health care provider.  Choose fish or white meat chicken and [place], roasted or baked. Limit fatty cuts of red meat, fried foods, and processed meats, such as sausage and lunch meats.  Eat lots of fresh fruits and vegetables.  Choose whole grains, beans, pasta, potatoes, and cereals.  Choose olive oil, corn oil, or canola oil, and use only small amounts.  Avoid butter, mayonnaise, shortening, or palm kernel oils.  Avoid foods with trans fats.  Drink skim or nonfat milk and eat low-fat or nonfat yogurt and cheeses. Avoid whole milk, cream, ice cream, egg yolks, and full-fat cheeses.  Healthier desserts include angel food cake, ginger snaps, animal crackers, hard candy, popsicles, and low-fat or nonfat frozen yogurt. Avoid pastries, cakes, pies, and cookies.  Exercise  Follow your exercise program as told by your health care provider. A regular program: ? Helps to decrease LDL and raise HDL. ? Helps with weight control.  Do things that increase your activity level, such as gardening, walking, and taking the stairs.  Ask your health care provider about ways that you can be more active in your daily life.  Medicine  Take over-the-counter and prescription medicines only as told by your health care provider. ? Medicine may be prescribed by  your health care provider to help lower cholesterol and decrease the risk for heart disease. This is usually done if diet and exercise have failed to bring down cholesterol levels. ? If you have several risk factors, you may need medicine even if your levels are normal.  This information is not intended to replace advice given to you by your health care  provider. Make sure you discuss any questions you have with your health care provider. Document Released: 05/05/2001 Document Revised: 03/07/2016 Document Reviewed: 02/08/2016 Elsevier Interactive Patient Education  [employer]2018 [employer]

## 2017-09-13 NOTE — Progress Notes (Signed)
Cardiology Office Note:    Date:  09/13/2017   ID:  Monique SeminoleJoann Boudoin, DOB 1946/04/17, MRN [id number]  PCP:  Gordan PaymentGrisso, Greg A., MD  Cardiologist:  Gypsy Balsamobert Krasowski, MD    Referring MD: Gordan PaymentGrisso, Greg A., MD   Chief Complaint  Patient presents with  . Follow-up  Denies having any problems  History of Present Illness:    Monique Lane is a 72 y.o. female with bradycardia as well as hypertension.  She actually stop all her medications she says she is doing well.  I question why she stopped it she tells me that she stopped because she did not feel well on this medication now she is great.  Apparently recently she lost her job and she is very sad about that.  Denies have any chest pain dizziness passing out nightmares.  Past Medical History:  Diagnosis Date  . Anxiety 04/18/2015   Last Assessment & Plan:  There is anxiety present here today but not as bad as I have seen before with her she is more anxious over us discussing her memory and what would need to be done in examining that  . Arthritis   . B-complex deficiency 09/04/2015  . Chronic rhinitis 09/24/2015  . Dyslipidemia 04/18/2015  . Essential hypertension 04/18/2015   Last Assessment & Plan:  She is much better with her norvasc she is tolerating this well and the dose is agreeing with her and she is not having any issue with edema for this either so will continue with it.  . Fatigue 09/04/2015  . Iron deficiency anemia, unspecified 09/04/2015  . Memory change 03/16/2016   Last Assessment & Plan:  She is obviously upset over the discussion about her memory and we discussed that she has had a change in her memory from the beginning of the year when her last one was done, and she scored 26/30 with some difficulty drawing the clock as well as she had difficulty with the three words and she wanted to have some help with remembering the first word which was not given to   . Menopausal disorder 09/04/2015  . Mixed hyperlipidemia 09/24/2015  . Palpitations  10/25/2015  . Peptic ulcer disease 10/25/2015  . Primary insomnia 10/25/2015  . Primary osteoarthritis involving multiple joints 03/03/2016   Last Assessment & Plan:  Discussed in depth with her and her husband she should try taking just the tylenol and avoid the pain med with the narcotic component , and she should see if that helps her as the husband was concerned about her memory and this impairing which I dont disagree with. She was lucid here today and could answer questions appropriately.  . Recurrent major depression (HCC) 09/04/2015  . Sinus bradycardia 04/18/2015  . Ulcer   . Vitamin B12 deficiency 09/24/2015    Past Surgical History:  Procedure Laterality Date  . BACK SURGERY    . CHOLECYSTECTOMY    . KNEE SURGERY     right  . REPLACEMENT TOTAL KNEE Right     Current Medications: Current Meds  Medication Sig  . LORazepam (ATIVAN) 0.5 MG tablet TAKE 1 TABLET (0.5 MG TOTAL) BY MOUTH NIGHTLY.  . traMADol (ULTRAM) 50 MG tablet Take by mouth.   Current Facility-Administered Medications for the 09/13/17 encounter (Office Visit) with Georgeanna LeaKrasowski, Robert J, MD  Medication  . triamcinolone acetonide (KENALOG) 10 MG/ML injection 10 mg  . triamcinolone acetonide (KENALOG) 10 MG/ML injection 10 mg     Allergies:   Demerol [meperidine]  Social History   Socioeconomic History  . Marital status: Married    Spouse name: None  . Number of children: None  . Years of education: None  . Highest education level: None  Social Needs  . Financial resource strain: None  . Food insecurity - worry: None  . Food insecurity - inability: None  . Transportation needs - medical: None  . Transportation needs - non-medical: None  Occupational History  . None  Tobacco Use  . Smoking status: Never Smoker  . Smokeless tobacco: Never Used  Substance and Sexual Activity  . Alcohol use: No  . Drug use: No  . Sexual activity: None  Other Topics Concern  . None  Social History Narrative  . None      Family History: The patient's family history includes Cancer in her father; Heart attack in her brother, mother, and sister. ROS:   Please see the history of present illness.    All 14 point review of systems negative except as described per history of present illness  EKGs/Labs/Other Studies Reviewed:      Recent Labs: No results found for requested labs within last 8760 hours.  Recent Lipid Panel No results found for: CHOL, TRIG, HDL, CHOLHDL, VLDL, LDLCALC, LDLDIRECT  Physical Exam:    VS:  BP 140/80 (BP Location: Right Arm, Patient Position: Sitting, Cuff Size: Normal)   Pulse 71   Ht 5\' 7"  (1.702 m)   Wt 140 lb (63.5 kg)   SpO2 98%   BMI 21.93 kg/m     Wt Readings from Last 3 Encounters:  09/13/17 140 lb (63.5 kg)     GEN:  Well nourished, well developed in no acute distress HEENT: Normal NECK: No JVD; No carotid bruits LYMPHATICS: No lymphadenopathy CARDIAC: RRR, no murmurs, no rubs, no gallops RESPIRATORY:  Clear to auscultation without rales, wheezing or rhonchi  ABDOMEN: Soft, non-tender, non-distended MUSCULOSKELETAL:  No edema; No deformity  SKIN: Warm and dry LOWER EXTREMITIES: no swelling NEUROLOGIC:  Alert and oriented x 3 PSYCHIATRIC:  Normal affect   ASSESSMENT:    1. Mixed hyperlipidemia   2. Essential hypertension   3. Sinus bradycardia    PLAN:    In order of problems listed above:  1. Mixed hyperlipidemia: We will ask her to have her fasting lipid profile done today.  She used to take simvastatin but stopped it 2. Essential hypertension: Blood pressure well controlled continue present management. 3. Sinus bradycardia: I talked to her about potentially having another Holter monitor she again declined.  Likely she appears to be asymptomatic.   Medication Adjustments/Labs and Tests Ordered: Current medicines are reviewed at length with the patient today.  Concerns regarding medicines are outlined above.  No orders of the defined types  were placed in this encounter.  Medication changes: No orders of the defined types were placed in this encounter.   Signed, [name], MD, [facility] 09/13/2017 4:17 PM    [facility]

## 2017-09-14 LAB — LIPID PANEL
CHOL/HDL RATIO: 3.5 ratio (ref 0.0–4.4)
Cholesterol, Total: 223 mg/dL — ABNORMAL HIGH (ref 100–199)
HDL: 63 mg/dL (ref >39)
LDL CALC: 140 mg/dL — AB (ref 0–99)
Triglycerides: 98 mg/dL (ref 0–149)
VLDL CHOLESTEROL CAL: 20 mg/dL (ref 5–40)

## 2017-11-09 DIAGNOSIS — Z96651 Presence of right artificial knee joint: Secondary | ICD-10-CM | POA: Insufficient documentation

## 2017-11-11 DIAGNOSIS — M25562 Pain in left knee: Secondary | ICD-10-CM | POA: Insufficient documentation

## 2018-03-07 ENCOUNTER — Ambulatory Visit: Payer: Medicare Other | Admitting: Cardiology

## 2018-04-01 ENCOUNTER — Ambulatory Visit (INDEPENDENT_AMBULATORY_CARE_PROVIDER_SITE_OTHER): Payer: Medicare Other | Admitting: Cardiology

## 2018-04-01 ENCOUNTER — Encounter: Payer: Self-pay | Admitting: Cardiology

## 2018-04-01 VITALS — BP 120/80 | HR 51 | Ht 67.0 in | Wt 137.0 lb

## 2018-04-01 DIAGNOSIS — R001 Bradycardia, unspecified: Secondary | ICD-10-CM

## 2018-04-01 DIAGNOSIS — I10 Essential (primary) hypertension: Secondary | ICD-10-CM

## 2018-04-01 DIAGNOSIS — E782 Mixed hyperlipidemia: Secondary | ICD-10-CM | POA: Diagnosis not present

## 2018-04-01 NOTE — Progress Notes (Signed)
Cardiology Office Note:    Date:  04/01/2018   ID:  Monique Lane, DOB 29-Jan-1946, MRN [id number]  PCP:  Gordan Payment., MD  Cardiologist:  Gypsy Balsam, MD    Referring MD: Gordan Payment., MD   Chief Complaint  Patient presents with  . Follow-up  Doing well  History of Present Illness:    Monique Lane is a 72 y.o. female with bradycardia I have been following her for years recently been struggling with bradycardia especially since she is not willing to perform any testing complain of being weak tired and exhausted but at the same time she walks on the regular basis have no difficulty doing it she did not notice any increase in exercise intolerance.  No chest pain tightness squeezing pressure burning chest she thinks she is fine  Past Medical History:  Diagnosis Date  . Anxiety 04/18/2015   Last Assessment & Plan:  There is anxiety present here today but not as bad as I have seen before with her she is more anxious over [place] discussing her memory and what would need to be done in examining that  . Arthritis   . B-complex deficiency 09/04/2015  . Chronic rhinitis 09/24/2015  . Dyslipidemia 04/18/2015  . Essential hypertension 04/18/2015   Last Assessment & Plan:  She is much better with her norvasc she is tolerating this well and the dose is agreeing with her and she is not having any issue with edema for this either so will continue with it.  . Fatigue 09/04/2015  . Iron deficiency anemia, unspecified 09/04/2015  . Memory change 03/16/2016   Last Assessment & Plan:  She is obviously upset over the discussion about her memory and we discussed that she has had a change in her memory from the beginning of the year when her last one was done, and she scored 26/30 with some difficulty drawing the clock as well as she had difficulty with the three words and she wanted to have some help with remembering the first word which was not given to   . Menopausal disorder 09/04/2015  . Mixed hyperlipidemia  09/24/2015  . Palpitations 10/25/2015  . Peptic ulcer disease 10/25/2015  . Primary insomnia 10/25/2015  . Primary osteoarthritis involving multiple joints 03/03/2016   Last Assessment & Plan:  Discussed in depth with her and her husband she should try taking just the tylenol and avoid the pain med with the narcotic component , and she should see if that helps her as the husband was concerned about her memory and this impairing which I dont disagree with. She was lucid here today and could answer questions appropriately.  . Recurrent major depression (HCC) 09/04/2015  . Sinus bradycardia 04/18/2015  . Ulcer   . Vitamin B12 deficiency 09/24/2015    Past Surgical History:  Procedure Laterality Date  . BACK SURGERY    . CHOLECYSTECTOMY    . KNEE SURGERY     right  . REPLACEMENT TOTAL KNEE Right     Current Medications: Current Meds  Medication Sig  . cyanocobalamin (,VITAMIN B-12,) 1000 MCG/ML injection INJECT 1,000 MCG AS DIRECTED EVERY 30 DAYS.  Marland Kitchen lisinopril-hydrochlorothiazide (PRINZIDE,ZESTORETIC) 20-12.5 MG tablet Take 1 tablet by mouth daily.  Marland Kitchen LORazepam (ATIVAN) 0.5 MG tablet TAKE 1 TABLET (0.5 MG TOTAL) BY MOUTH NIGHTLY.  . traMADol (ULTRAM) 50 MG tablet Take by mouth.   Current Facility-Administered Medications for the 04/01/18 encounter (Office Visit) with [name], MD  Medication  . triamcinolone  acetonide (KENALOG) 10 MG/ML injection 10 mg  . triamcinolone acetonide (KENALOG) 10 MG/ML injection 10 mg     Allergies:   Demerol [meperidine]   Social History   Socioeconomic History  . Marital status: Married    Spouse name: Not on file  . Number of children: Not on file  . Years of education: Not on file  . Highest education level: Not on file  Occupational History  . Not on file  Social Needs  . Financial resource strain: Not on file  . Food insecurity:    Worry: Not on file    Inability: Not on file  . Transportation needs:    Medical: Not on file     Non-medical: Not on file  Tobacco Use  . Smoking status: Never Smoker  . Smokeless tobacco: Never Used  Substance and Sexual Activity  . Alcohol use: No  . Drug use: No  . Sexual activity: Not on file  Lifestyle  . Physical activity:    Days per week: Not on file    Minutes per session: Not on file  . Stress: Not on file  Relationships  . Social connections:    Talks on phone: Not on file    Gets together: Not on file    Attends religious service: Not on file    Active member of club or organization: Not on file    Attends meetings of clubs or organizations: Not on file    Relationship status: Not on file  Other Topics Concern  . Not on file  Social History Narrative  . Not on file     Family History: The patient's family history includes Cancer in her father; Heart attack in her brother, mother, and sister. ROS:   Please see the history of present illness.    All 14 point review of systems negative except as described per history of present illness  EKGs/Labs/Other Studies Reviewed:      Recent Labs: No results found for requested labs within last 8760 hours.  Recent Lipid Panel    Component Value Date/Time   CHOL 223 (H) 09/13/2017 1639   TRIG 98 09/13/2017 1639   HDL 63 09/13/2017 1639   CHOLHDL 3.5 09/13/2017 1639   LDLCALC 140 (H) 09/13/2017 1639    Physical Exam:    VS:  BP 120/80 (BP Location: Right Arm, Patient Position: Sitting, Cuff Size: Normal)   Pulse (!) 51   Ht 5\' 7"  (1.702 m)   Wt 137 lb (62.1 kg)   SpO2 98%   BMI 21.46 kg/m     Wt Readings from Last 3 Encounters:  04/01/18 137 lb (62.1 kg)  09/13/17 140 lb (63.5 kg)     GEN:  Well nourished, well developed in no acute distress HEENT: Normal NECK: No JVD; No carotid bruits LYMPHATICS: No lymphadenopathy CARDIAC: RRR, no murmurs, no rubs, no gallops RESPIRATORY:  Clear to auscultation without rales, wheezing or rhonchi  ABDOMEN: Soft, non-tender, non-distended MUSCULOSKELETAL:  No  edema; No deformity  SKIN: Warm and dry LOWER EXTREMITIES: no swelling NEUROLOGIC:  Alert and oriented x 3 PSYCHIATRIC:  Normal affect   ASSESSMENT:    1. Sinus bradycardia   2. Essential hypertension   3. Mixed hyperlipidemia    PLAN:    In order of problems listed above:  1. Sinus bradycardia also had 24 hours Holter monitor however she declined and she did this previously repeatedly.  I told her if she got any dizziness or syncope she  need to let me know. 2. Essential hypertension blood pressure appears to be well controlled. 3. Dyslipidemia I will try to contact her family physician to see if she get any fasting lipid profile done lately.  I see her back in my office in about 6 months or sooner if she get a problem   Medication Adjustments/Labs and Tests Ordered: Current medicines are reviewed at length with the patient today.  Concerns regarding medicines are outlined above.  No orders of the defined types were placed in this encounter.  Medication changes: No orders of the defined types were placed in this encounter.   Signed, [name], MD, [facility] 04/01/2018 11:07 AM    [facility]

## 2018-04-01 NOTE — Patient Instructions (Signed)

## 2018-10-26 ENCOUNTER — Encounter: Payer: Self-pay | Admitting: Cardiology

## 2018-10-26 ENCOUNTER — Ambulatory Visit (INDEPENDENT_AMBULATORY_CARE_PROVIDER_SITE_OTHER): Payer: Medicare Other | Admitting: Cardiology

## 2018-10-26 VITALS — BP 132/70 | HR 63 | Wt 138.4 lb

## 2018-10-26 DIAGNOSIS — I10 Essential (primary) hypertension: Secondary | ICD-10-CM | POA: Diagnosis not present

## 2018-10-26 DIAGNOSIS — R001 Bradycardia, unspecified: Secondary | ICD-10-CM | POA: Diagnosis not present

## 2018-10-26 DIAGNOSIS — E785 Hyperlipidemia, unspecified: Secondary | ICD-10-CM

## 2018-10-26 DIAGNOSIS — R5383 Other fatigue: Secondary | ICD-10-CM | POA: Diagnosis not present

## 2018-10-26 NOTE — Progress Notes (Signed)
Cardiology Office Note:    Date:  10/26/2018   ID:  Monique Lane, DOB 08/03/1946, MRN [id number]  PCP:  Gordan Payment., MD  Cardiologist:  Gypsy Balsam, MD    Referring MD: Gordan Payment., MD   Chief Complaint  Patient presents with  . Follow-up  Doing well  History of Present Illness:    Monique Lane is a 73 y.o. female with sinus bradycardia.  Comes today to my office for follow-up overall she is doing well she is very cheerful and she said she walks a lot every day with no difficulties no dizziness no passing out.  She stopped taking some of the medication she stopped taking Lipitor as well as lisinopril hydrochlorothiazide she does not remember exactly why she stopped it she wants me to check her cholesterol which I will do.  Past Medical History:  Diagnosis Date  . Anxiety 04/18/2015   Last Assessment & Plan:  There is anxiety present here today but not as bad as I have seen before with her she is more anxious over [place] discussing her memory and what would need to be done in examining that  . Arthritis   . B-complex deficiency 09/04/2015  . Chronic rhinitis 09/24/2015  . Dyslipidemia 04/18/2015  . Essential hypertension 04/18/2015   Last Assessment & Plan:  She is much better with her norvasc she is tolerating this well and the dose is agreeing with her and she is not having any issue with edema for this either so will continue with it.  . Fatigue 09/04/2015  . Iron deficiency anemia, unspecified 09/04/2015  . Memory change 03/16/2016   Last Assessment & Plan:  She is obviously upset over the discussion about her memory and we discussed that she has had a change in her memory from the beginning of the year when her last one was done, and she scored 26/30 with some difficulty drawing the clock as well as she had difficulty with the three words and she wanted to have some help with remembering the first word which was not given to   . Menopausal disorder 09/04/2015  . Mixed hyperlipidemia  09/24/2015  . Palpitations 10/25/2015  . Peptic ulcer disease 10/25/2015  . Primary insomnia 10/25/2015  . Primary osteoarthritis involving multiple joints 03/03/2016   Last Assessment & Plan:  Discussed in depth with her and her husband she should try taking just the tylenol and avoid the pain med with the narcotic component , and she should see if that helps her as the husband was concerned about her memory and this impairing which I dont disagree with. She was lucid here today and could answer questions appropriately.  . Recurrent major depression (HCC) 09/04/2015  . Sinus bradycardia 04/18/2015  . Ulcer   . Vitamin B12 deficiency 09/24/2015    Past Surgical History:  Procedure Laterality Date  . BACK SURGERY    . CHOLECYSTECTOMY    . KNEE SURGERY     right  . REPLACEMENT TOTAL KNEE Right     Current Medications: Current Meds  Medication Sig  . cyanocobalamin (,VITAMIN B-12,) 1000 MCG/ML injection INJECT 1,000 MCG AS DIRECTED EVERY 30 DAYS.   Current Facility-Administered Medications for the 10/26/18 encounter (Office Visit) with [name], MD  Medication  . triamcinolone acetonide (KENALOG) 10 MG/ML injection 10 mg  . triamcinolone acetonide (KENALOG) 10 MG/ML injection 10 mg     Allergies:   Demerol [meperidine]   Social History   Socioeconomic History  .  Marital status: Married    Spouse name: Not on file  . Number of children: Not on file  . Years of education: Not on file  . Highest education level: Not on file  Occupational History  . Not on file  Social Needs  . Financial resource strain: Not on file  . Food insecurity:    Worry: Not on file    Inability: Not on file  . Transportation needs:    Medical: Not on file    Non-medical: Not on file  Tobacco Use  . Smoking status: Never Smoker  . Smokeless tobacco: Never Used  Substance and Sexual Activity  . Alcohol use: No  . Drug use: No  . Sexual activity: Not on file  Lifestyle  . Physical activity:     Days per week: Not on file    Minutes per session: Not on file  . Stress: Not on file  Relationships  . Social connections:    Talks on phone: Not on file    Gets together: Not on file    Attends religious service: Not on file    Active member of club or organization: Not on file    Attends meetings of clubs or organizations: Not on file    Relationship status: Not on file  Other Topics Concern  . Not on file  Social History Narrative  . Not on file     Family History: The patient's family history includes Cancer in her father; Heart attack in her brother, mother, and sister. ROS:   Please see the history of present illness.    All 14 point review of systems negative except as described per history of present illness  EKGs/Labs/Other Studies Reviewed:    EKG shows sinus bradycardia rate of 58 normal P interval normal QS complex duration morphology no ST segment changes  Recent Labs: No results found for requested labs within last 8760 hours.  Recent Lipid Panel    Component Value Date/Time   CHOL 223 (H) 09/13/2017 1639   TRIG 98 09/13/2017 1639   HDL 63 09/13/2017 1639   CHOLHDL 3.5 09/13/2017 1639   LDLCALC 140 (H) 09/13/2017 1639    Physical Exam:    VS:  BP 132/70   Pulse 63   Wt 138 lb 6.4 oz (62.8 kg)   SpO2 98%   BMI 21.68 kg/m     Wt Readings from Last 3 Encounters:  10/26/18 138 lb 6.4 oz (62.8 kg)  04/01/18 137 lb (62.1 kg)  09/13/17 140 lb (63.5 kg)     GEN:  Well nourished, well developed in no acute distress HEENT: Normal NECK: No JVD; No carotid bruits LYMPHATICS: No lymphadenopathy CARDIAC: RRR, no murmurs, no rubs, no gallops RESPIRATORY:  Clear to auscultation without rales, wheezing or rhonchi  ABDOMEN: Soft, non-tender, non-distended MUSCULOSKELETAL:  No edema; No deformity  SKIN: Warm and dry LOWER EXTREMITIES: no swelling NEUROLOGIC:  Alert and oriented x 3 PSYCHIATRIC:  Normal affect   ASSESSMENT:    1. Sinus bradycardia     2. Essential hypertension   3. Fatigue, unspecified type   4. Dyslipidemia    PLAN:    In order of problems listed above:  1. Sinus bradycardia doing well again does not want to wear any monitor for it.  Denies having any symptoms.  The mechanism of bradycardia is sinus.  We will continue monitoring I want her if she passed out get dizzy spells she need to let us know. 2. Essential  hypertension blood pressure well controlled continue present management. 3. Fatigue present noted. 4. Dyslipidemia we will check a fasting lipid profile today.   Medication Adjustments/Labs and Tests Ordered: Current medicines are reviewed at length with the patient today.  Concerns regarding medicines are outlined above.  Orders Placed This Encounter  Procedures  . EKG 12-Lead   Medication changes: No orders of the defined types were placed in this encounter.   Signed, [name], MD, [facility] 10/26/2018 8:11 AM    [facility]

## 2018-10-26 NOTE — Addendum Note (Signed)
Addended by: Pamala Hurry on: 10/26/2018 08:16 AM   Modules accepted: Orders

## 2018-10-26 NOTE — Patient Instructions (Addendum)
Medication Instructions:  Your physician recommends that you continue on your current medications as directed. Please refer to the Current Medication list given to you today.  If you need a refill on your cardiac medications before your next appointment, please call your pharmacy.   Lab work: Your physician recommends that you have a lipid panel and Hepatic function drawn today.  If you have labs (blood work) drawn today and your tests are completely normal, you will receive your results only by: Marland Kitchen MyChart Message (if you have MyChart) OR . A paper copy in the mail If you have any lab test that is abnormal or we need to change your treatment, we will call you to review the results.  Testing/Procedures: You had an EKG performed today.   Follow-Up: At [facility], you and your health needs are our priority.  As part of our continuing mission to provide you with exceptional heart care, we have created designated Provider Care Teams.  These Care Teams include your primary Cardiologist (physician) and Advanced Practice Providers (APPs -  Physician Assistants and Nurse Practitioners) who all work together to provide you with the care you need, when you need it. You will need a follow up appointment in 6 months.

## 2018-10-27 LAB — LIPID PANEL
CHOLESTEROL TOTAL: 204 mg/dL — AB (ref 100–199)
Chol/HDL Ratio: 3.4 ratio (ref 0.0–4.4)
HDL: 60 mg/dL (ref >39)
LDL Calculated: 132 mg/dL — ABNORMAL HIGH (ref 0–99)
TRIGLYCERIDES: 60 mg/dL (ref 0–149)
VLDL CHOLESTEROL CAL: 12 mg/dL (ref 5–40)

## 2018-10-27 LAB — HEPATIC FUNCTION PANEL
ALK PHOS: 72 IU/L (ref 39–117)
ALT: 14 IU/L (ref 0–32)
AST: 21 IU/L (ref 0–40)
Albumin: 3.9 g/dL (ref 3.7–4.7)
BILIRUBIN, DIRECT: 0.09 mg/dL (ref 0.00–0.40)
Bilirubin Total: 0.4 mg/dL (ref 0.0–1.2)
Total Protein: 5.9 g/dL — ABNORMAL LOW (ref 6.0–8.5)

## 2019-04-28 ENCOUNTER — Ambulatory Visit: Payer: Medicare Other | Admitting: Cardiology

## 2019-05-08 ENCOUNTER — Encounter: Payer: Self-pay | Admitting: Cardiology

## 2019-05-08 ENCOUNTER — Other Ambulatory Visit: Payer: Self-pay

## 2019-05-08 ENCOUNTER — Ambulatory Visit (INDEPENDENT_AMBULATORY_CARE_PROVIDER_SITE_OTHER): Payer: Medicare Other | Admitting: Cardiology

## 2019-05-08 VITALS — BP 134/62 | HR 55 | Ht 67.0 in | Wt 132.0 lb

## 2019-05-08 DIAGNOSIS — E785 Hyperlipidemia, unspecified: Secondary | ICD-10-CM

## 2019-05-08 DIAGNOSIS — R002 Palpitations: Secondary | ICD-10-CM | POA: Diagnosis not present

## 2019-05-08 DIAGNOSIS — R001 Bradycardia, unspecified: Secondary | ICD-10-CM

## 2019-05-08 NOTE — Progress Notes (Signed)
Cardiology Office Note:    Date:  05/08/2019   ID:  [name], DOB [birth year], MRN [id number]  PCP:  Raina Mina., MD  Cardiologist:  Jenne Campus, MD    Referring MD: Raina Mina., MD   Chief Complaint  Patient presents with  . Follow-up  Doing very well  History of Present Illness:    Monique Lane is a 73 y.o. female with bradycardia as well as dyslipidemia.  Also some memory issue.  She is like always very cheerful and happy she tells me that she walks a lot and she has no difficulty doing it her husband confirmed that.  No dizziness no passing out no nightmares.  Overall seems to be doing well.  Past Medical History:  Diagnosis Date  . Anxiety 04/18/2015   Last Assessment & Plan:  There is anxiety present here today but not as bad as I have seen before with her she is more anxious over [place] discussing her memory and what would need to be done in examining that  . Arthritis   . B-complex deficiency 09/04/2015  . Chronic rhinitis 09/24/2015  . Dyslipidemia 04/18/2015  . Essential hypertension 04/18/2015   Last Assessment & Plan:  She is much better with her norvasc she is tolerating this well and the dose is agreeing with her and she is not having any issue with edema for this either so will continue with it.  . Fatigue 09/04/2015  . Iron deficiency anemia, unspecified 09/04/2015  . Memory change 03/16/2016   Last Assessment & Plan:  She is obviously upset over the discussion about her memory and we discussed that she has had a change in her memory from the beginning of the year when her last one was done, and she scored 26/30 with some difficulty drawing the clock as well as she had difficulty with the three words and she wanted to have some help with remembering the first word which was not given to   . Menopausal disorder 09/04/2015  . Mixed hyperlipidemia 09/24/2015  . Palpitations 10/25/2015  . Peptic ulcer disease 10/25/2015  . Primary insomnia 10/25/2015  . Primary  osteoarthritis involving multiple joints 03/03/2016   Last Assessment & Plan:  Discussed in depth with her and her husband she should try taking just the tylenol and avoid the pain med with the narcotic component , and she should see if that helps her as the husband was concerned about her memory and this impairing which I dont disagree with. She was lucid here today and could answer questions appropriately.  . Recurrent major depression ([place]) 09/04/2015  . Sinus bradycardia 04/18/2015  . Ulcer   . Vitamin B12 deficiency 09/24/2015    Past Surgical History:  Procedure Laterality Date  . BACK SURGERY    . CHOLECYSTECTOMY    . KNEE SURGERY     right  . REPLACEMENT TOTAL KNEE Right     Current Medications: Current Meds  Medication Sig  . atorvastatin (LIPITOR) 20 MG tablet Take 20 mg by mouth at bedtime.  . cyanocobalamin (,VITAMIN B-12,) 1000 MCG/ML injection INJECT 1,000 MCG AS DIRECTED EVERY 30 DAYS.  Marland Kitchen lisinopril-hydrochlorothiazide (PRINZIDE,ZESTORETIC) 20-12.5 MG tablet Take 1 tablet by mouth daily.   Current Facility-Administered Medications for the 05/08/19 encounter (Office Visit) with Park Liter, MD  Medication  . triamcinolone acetonide (KENALOG) 10 MG/ML injection 10 mg  . triamcinolone acetonide (KENALOG) 10 MG/ML injection 10 mg     Allergies:   Demerol [meperidine]  Social History   Socioeconomic History  . Marital status: Married    Spouse name: Not on file  . Number of children: Not on file  . Years of education: Not on file  . Highest education level: Not on file  Occupational History  . Not on file  Social Needs  . Financial resource strain: Not on file  . Food insecurity    Worry: Not on file    Inability: Not on file  . Transportation needs    Medical: Not on file    Non-medical: Not on file  Tobacco Use  . Smoking status: Never Smoker  . Smokeless tobacco: Never Used  Substance and Sexual Activity  . Alcohol use: No  . Drug use: No  .  Sexual activity: Not on file  Lifestyle  . Physical activity    Days per week: Not on file    Minutes per session: Not on file  . Stress: Not on file  Relationships  . Social Musicianconnections    Talks on phone: Not on file    Gets together: Not on file    Attends religious service: Not on file    Active member of club or organization: Not on file    Attends meetings of clubs or organizations: Not on file    Relationship status: Not on file  Other Topics Concern  . Not on file  Social History Narrative  . Not on file     Family History: The patient's family history includes Cancer in her father; Heart attack in her brother, mother, and sister. ROS:   Please see the history of present illness.    All 14 point review of systems negative except as described per history of present illness  EKGs/Labs/Other Studies Reviewed:      Recent Labs: 10/26/2018: ALT 14  Recent Lipid Panel    Component Value Date/Time   CHOL 204 (H) 10/26/2018 0832   TRIG 60 10/26/2018 0832   HDL 60 10/26/2018 0832   CHOLHDL 3.4 10/26/2018 0832   LDLCALC 132 (H) 10/26/2018 0832    Physical Exam:    VS:  BP 134/62   Pulse (!) 55   Ht 5\' 7"  (1.702 m)   Wt 132 lb (59.9 kg)   SpO2 98%   BMI 20.67 kg/m     Wt Readings from Last 3 Encounters:  05/08/19 132 lb (59.9 kg)  10/26/18 138 lb 6.4 oz (62.8 kg)  04/01/18 137 lb (62.1 kg)     GEN:  Well nourished, well developed in no acute distress HEENT: Normal NECK: No JVD; No carotid bruits LYMPHATICS: No lymphadenopathy CARDIAC: RRR, no murmurs, no rubs, no gallops RESPIRATORY:  Clear to auscultation without rales, wheezing or rhonchi  ABDOMEN: Soft, non-tender, non-distended MUSCULOSKELETAL:  No edema; No deformity  SKIN: Warm and dry LOWER EXTREMITIES: no swelling NEUROLOGIC:  Alert and oriented x 3 PSYCHIATRIC:  Normal affect   ASSESSMENT:    1. Palpitations   2. Dyslipidemia   3. Sinus bradycardia    PLAN:    In order of problems  listed above:  1. Palpitations denies having any. 2. Dyslipidemia takes atorvastatin which I will continue.  Follow-up with Dr. Shary DecampGrisso office 3. Sinus bradycardia asymptomatic does not want to wear monitor.   Medication Adjustments/Labs and Tests Ordered: Current medicines are reviewed at length with the patient today.  Concerns regarding medicines are outlined above.  No orders of the defined types were placed in this encounter.  Medication changes: No  orders of the defined types were placed in this encounter.   Signed, [name], MD, [facility] 05/08/2019 4:08 PM    [facility]

## 2019-05-08 NOTE — Patient Instructions (Signed)
Medication Instructions:  Your physician recommends that you continue on your current medications as directed. Please refer to the Current Medication list given to you today.  If you need a refill on your cardiac medications before your next appointment, please call your pharmacy.   Lab work: None If you have labs (blood work) drawn today and your tests are completely normal, you will receive your results only by: . MyChart Message (if you have MyChart) OR . A paper copy in the mail If you have any lab test that is abnormal or we need to change your treatment, we will call you to review the results.  Testing/Procedures: None  Follow-Up: At [facility], you and your health needs are our priority.  As part of our continuing mission to provide you with exceptional heart care, we have created designated Provider Care Teams.  These Care Teams include your primary Cardiologist (physician) and Advanced Practice Providers (APPs -  Physician Assistants and Nurse Practitioners) who all work together to provide you with the care you need, when you need it. You will need a follow up appointment in 6 months with Dr Krasowski. Any Other Special Instructions Will Be Listed Below (If Applicable).    

## 2020-07-02 ENCOUNTER — Other Ambulatory Visit: Payer: Self-pay

## 2020-07-02 ENCOUNTER — Encounter: Payer: Self-pay | Admitting: Cardiology

## 2020-07-02 ENCOUNTER — Ambulatory Visit (INDEPENDENT_AMBULATORY_CARE_PROVIDER_SITE_OTHER): Payer: Medicare Other | Admitting: Cardiology

## 2020-07-02 VITALS — BP 138/80 | HR 54 | Ht 67.0 in | Wt 125.0 lb

## 2020-07-02 DIAGNOSIS — I10 Essential (primary) hypertension: Secondary | ICD-10-CM | POA: Diagnosis not present

## 2020-07-02 DIAGNOSIS — R002 Palpitations: Secondary | ICD-10-CM

## 2020-07-02 DIAGNOSIS — R001 Bradycardia, unspecified: Secondary | ICD-10-CM | POA: Diagnosis not present

## 2020-07-02 DIAGNOSIS — E785 Hyperlipidemia, unspecified: Secondary | ICD-10-CM

## 2020-07-02 NOTE — Progress Notes (Signed)
Cardiology Office Note:    Date:  07/02/2020   ID:  Monique Lane, DOB 27-Jun-1946, MRN [id number]  PCP:  Gordan Payment., MD  Cardiologist:  Gypsy Balsam, MD    Referring MD: Gordan Payment., MD   Chief Complaint  Patient presents with  . Follow-up  I am doing very well  History of Present Illness:    Monique Lane is a 74 y.o. female with past medical history significant for bradycardia, however she is completely asymptomatic, essential hypertension, some memory issues.  Dyslipidemia.  She comes today 2 months for follow-up.  Overall she is doing great.  She denies have any chest pain tightness squeezing pressure burning chest.  She is trying to walk on the regular basis also ride a bike every day.  Denies having any nightmares.  No dizziness no passing out.  Past Medical History:  Diagnosis Date  . Anxiety 04/18/2015   Last Assessment & Plan:  There is anxiety present here today but not as bad as I have seen before with her she is more anxious over [place] discussing her memory and what would need to be done in examining that  . Arthritis   . B-complex deficiency 09/04/2015  . Chronic rhinitis 09/24/2015  . Dyslipidemia 04/18/2015  . Essential hypertension 04/18/2015   Last Assessment & Plan:  She is much better with her norvasc she is tolerating this well and the dose is agreeing with her and she is not having any issue with edema for this either so will continue with it.  . Fatigue 09/04/2015  . Iron deficiency anemia, unspecified 09/04/2015  . Memory change 03/16/2016   Last Assessment & Plan:  She is obviously upset over the discussion about her memory and we discussed that she has had a change in her memory from the beginning of the year when her last one was done, and she scored 26/30 with some difficulty drawing the clock as well as she had difficulty with the three words and she wanted to have some help with remembering the first word which was not given to   . Menopausal disorder  09/04/2015  . Mixed hyperlipidemia 09/24/2015  . Palpitations 10/25/2015  . Peptic ulcer disease 10/25/2015  . Primary insomnia 10/25/2015  . Primary osteoarthritis involving multiple joints 03/03/2016   Last Assessment & Plan:  Discussed in depth with her and her husband she should try taking just the tylenol and avoid the pain med with the narcotic component , and she should see if that helps her as the husband was concerned about her memory and this impairing which I dont disagree with. She was lucid here today and could answer questions appropriately.  . Recurrent major depression (HCC) 09/04/2015  . Sinus bradycardia 04/18/2015  . Ulcer   . Vitamin B12 deficiency 09/24/2015    Past Surgical History:  Procedure Laterality Date  . BACK SURGERY    . CHOLECYSTECTOMY    . KNEE SURGERY     right  . REPLACEMENT TOTAL KNEE Right     Current Medications: Current Meds  Medication Sig  . cyanocobalamin (,VITAMIN B-12,) 1000 MCG/ML injection INJECT 1,000 MCG AS DIRECTED EVERY 30 DAYS.   Current Facility-Administered Medications for the 07/02/20 encounter (Office Visit) with [name], MD  Medication  . triamcinolone acetonide (KENALOG) 10 MG/ML injection 10 mg  . triamcinolone acetonide (KENALOG) 10 MG/ML injection 10 mg     Allergies:   Demerol [meperidine]   Social History   Socioeconomic History  .  Marital status: Married    Spouse name: Not on file  . Number of children: Not on file  . Years of education: Not on file  . Highest education level: Not on file  Occupational History  . Not on file  Tobacco Use  . Smoking status: Never Smoker  . Smokeless tobacco: Never Used  Substance and Sexual Activity  . Alcohol use: No  . Drug use: No  . Sexual activity: Not on file  Other Topics Concern  . Not on file  Social History Narrative  . Not on file   Social Determinants of Health   Financial Resource Strain:   . Difficulty of Paying Living Expenses: Not on file  Food  Insecurity:   . Worried About Programme researcher, broadcasting/film/video in the Last Year: Not on file  . Ran Out of Food in the Last Year: Not on file  Transportation Needs:   . Lack of Transportation (Medical): Not on file  . Lack of Transportation (Non-Medical): Not on file  Physical Activity:   . Days of Exercise per Week: Not on file  . Minutes of Exercise per Session: Not on file  Stress:   . Feeling of Stress : Not on file  Social Connections:   . Frequency of Communication with Friends and Family: Not on file  . Frequency of Social Gatherings with Friends and Family: Not on file  . Attends Religious Services: Not on file  . Active Member of Clubs or Organizations: Not on file  . Attends Banker Meetings: Not on file  . Marital Status: Not on file     Family History: The patient's family history includes Cancer in her father; Heart attack in her brother, mother, and sister. ROS:   Please see the history of present illness.    All 14 point review of systems negative except as described per history of present illness  EKGs/Labs/Other Studies Reviewed:      Recent Labs: No results found for requested labs within last 8760 hours.  Recent Lipid Panel    Component Value Date/Time   CHOL 204 (H) 10/26/2018 0832   TRIG 60 10/26/2018 0832   HDL 60 10/26/2018 0832   CHOLHDL 3.4 10/26/2018 0832   LDLCALC 132 (H) 10/26/2018 0832    Physical Exam:    VS:  BP 138/80 (BP Location: Left Arm, Patient Position: Sitting, Cuff Size: Normal)   Pulse (!) 54   Ht 5\' 7"  (1.702 m)   Wt 125 lb (56.7 kg)   SpO2 96%   BMI 19.58 kg/m     Wt Readings from Last 3 Encounters:  07/02/20 125 lb (56.7 kg)  05/08/19 132 lb (59.9 kg)  10/26/18 138 lb 6.4 oz (62.8 kg)     GEN:  Well nourished, well developed in no acute distress HEENT: Normal NECK: No JVD; No carotid bruits LYMPHATICS: No lymphadenopathy CARDIAC: RRR, no murmurs, no rubs, no gallops RESPIRATORY:  Clear to auscultation without  rales, wheezing or rhonchi  ABDOMEN: Soft, non-tender, non-distended MUSCULOSKELETAL:  No edema; No deformity  SKIN: Warm and dry LOWER EXTREMITIES: no swelling NEUROLOGIC:  Alert and oriented x 3 PSYCHIATRIC:  Normal affect   ASSESSMENT:    1. Palpitations   2. Sinus bradycardia   3. Essential hypertension   4. Dyslipidemia    PLAN:    In order of problems listed above:  1. Palpitations denies having any. 2. Sinus bradycardia I offer her monitor she declined.  Her EKG today shows  sinus bradycardia rate 54.  Possible left atrium enlargement.  I warned her about potential signs and symptoms of significant bradycardia including dizziness or passing out I asked her to let me know if it happens. 3. Essential hypertension blood pressure is perfectly controlled with medications which I will continue. 4. Dyslipidemia: I did review data from her primary care physician showing total cholesterol 207, LDL 129, HDL 60.  It looks like she does not take any cholesterol medication does not want to take   Medication Adjustments/Labs and Tests Ordered: Current medicines are reviewed at length with the patient today.  Concerns regarding medicines are outlined above.  No orders of the defined types were placed in this encounter.  Medication changes: No orders of the defined types were placed in this encounter.   Signed, [name], MD, [facility] 07/02/2020 2:20 PM    [facility]

## 2020-07-02 NOTE — Patient Instructions (Signed)

## 2020-10-15 DIAGNOSIS — R634 Abnormal weight loss: Secondary | ICD-10-CM | POA: Insufficient documentation

## 2020-10-15 DIAGNOSIS — F03918 Unspecified dementia, unspecified severity, with other behavioral disturbance: Secondary | ICD-10-CM | POA: Insufficient documentation

## 2021-01-15 DIAGNOSIS — M19029 Primary osteoarthritis, unspecified elbow: Secondary | ICD-10-CM | POA: Insufficient documentation

## 2021-02-06 ENCOUNTER — Ambulatory Visit (INDEPENDENT_AMBULATORY_CARE_PROVIDER_SITE_OTHER): Payer: Medicare Other | Admitting: Cardiology

## 2021-02-06 ENCOUNTER — Encounter: Payer: Self-pay | Admitting: Cardiology

## 2021-02-06 ENCOUNTER — Other Ambulatory Visit: Payer: Self-pay

## 2021-02-06 VITALS — BP 150/76 | HR 58 | Ht 67.0 in | Wt 117.0 lb

## 2021-02-06 DIAGNOSIS — I10 Essential (primary) hypertension: Secondary | ICD-10-CM

## 2021-02-06 DIAGNOSIS — E782 Mixed hyperlipidemia: Secondary | ICD-10-CM

## 2021-02-06 DIAGNOSIS — R002 Palpitations: Secondary | ICD-10-CM

## 2021-02-06 DIAGNOSIS — R001 Bradycardia, unspecified: Secondary | ICD-10-CM | POA: Diagnosis not present

## 2021-02-06 NOTE — Patient Instructions (Signed)

## 2021-02-06 NOTE — Progress Notes (Signed)
Cardiology Office Note:    Date:  02/06/2021   ID:  Monique Lane, DOB 12/11/1945, MRN [id number]  PCP:  Gordan Payment., MD  Cardiologist:  Gypsy Balsam, MD    Referring MD: Gordan Payment., MD   Chief Complaint  Patient presents with   Follow-up  I am doing fine  History of Present Illness:    Monique Lane is a 75 y.o. female with past medical history significant for sinus bradycardia, essential hypertension, dyslipidemia, memory problems.  She is coming today to my office for follow-up.  Interestingly she is alone here in my room.  She says she is doing great she denies have any chest pain tightness squeezing pressure burning chest.  There is no dizziness there is no passing out.  Overall she seems to be doing well.  However I know from her husband who is my patient was in my office few days ago that she could not have significant problem with her memory.  Still trying to function she tells me that she is walking on the regular basis as well as riding a bike I have no difficulty doing it.  She clearly tells me that she does have any dizziness or passing out.  Past Medical History:  Diagnosis Date   Anxiety 04/18/2015   Last Assessment & Plan:  There is anxiety present here today but not as bad as I have seen before with her she is more anxious over [place] discussing her memory and what would need to be done in examining that   Arthritis    B-complex deficiency 09/04/2015   Chronic rhinitis 09/24/2015   Dyslipidemia 04/18/2015   Essential hypertension 04/18/2015   Last Assessment & Plan:  She is much better with her norvasc she is tolerating this well and the dose is agreeing with her and she is not having any issue with edema for this either so will continue with it.   Fatigue 09/04/2015   Iron deficiency anemia, unspecified 09/04/2015   Memory change 03/16/2016   Last Assessment & Plan:  She is obviously upset over the discussion about her memory and we discussed that she has had a change in  her memory from the beginning of the year when her last one was done, and she scored 26/30 with some difficulty drawing the clock as well as she had difficulty with the three words and she wanted to have some help with remembering the first word which was not given to    Menopausal disorder 09/04/2015   Mixed hyperlipidemia 09/24/2015   Palpitations 10/25/2015   Peptic ulcer disease 10/25/2015   Primary insomnia 10/25/2015   Primary osteoarthritis involving multiple joints 03/03/2016   Last Assessment & Plan:  Discussed in depth with her and her husband she should try taking just the tylenol and avoid the pain med with the narcotic component , and she should see if that helps her as the husband was concerned about her memory and this impairing which I dont disagree with. She was lucid here today and could answer questions appropriately.   Recurrent major depression (HCC) 09/04/2015   Sinus bradycardia 04/18/2015   Ulcer    Vitamin B12 deficiency 09/24/2015    Past Surgical History:  Procedure Laterality Date   BACK SURGERY     CHOLECYSTECTOMY     KNEE SURGERY     right   REPLACEMENT TOTAL KNEE Right     Current Medications: Current Meds  Medication Sig   cyanocobalamin (,VITAMIN B-12,) 1000  MCG/ML injection INJECT 1,000 MCG AS DIRECTED EVERY 30 DAYS.   Current Facility-Administered Medications for the 02/06/21 encounter (Office Visit) with Monique Lea, MD  Medication   triamcinolone acetonide (KENALOG) 10 MG/ML injection 10 mg   triamcinolone acetonide (KENALOG) 10 MG/ML injection 10 mg     Allergies:   Demerol [meperidine]   Social History   Socioeconomic History   Marital status: Married    Spouse name: Not on file   Number of children: Not on file   Years of education: Not on file   Highest education level: Not on file  Occupational History   Not on file  Tobacco Use   Smoking status: Never   Smokeless tobacco: Never  Substance and Sexual Activity   Alcohol use: No    Drug use: No   Sexual activity: Not on file  Other Topics Concern   Not on file  Social History Narrative   Not on file   Social Determinants of Health   Financial Resource Strain: Not on file  Food Insecurity: Not on file  Transportation Needs: Not on file  Physical Activity: Not on file  Stress: Not on file  Social Connections: Not on file     Family History: The patient's family history includes Cancer in her father; Heart attack in her brother, mother, and sister. ROS:   Please see the history of present illness.    All 14 point review of systems negative except as described per history of present illness  EKGs/Labs/Other Studies Reviewed:      Recent Labs: No results found for requested labs within last 8760 hours.  Recent Lipid Panel    Component Value Date/Time   CHOL 204 (H) 10/26/2018 0832   TRIG 60 10/26/2018 0832   HDL 60 10/26/2018 0832   CHOLHDL 3.4 10/26/2018 0832   LDLCALC 132 (H) 10/26/2018 0832    Physical Exam:    VS:  BP (!) 150/76 (BP Location: Right Arm, Patient Position: Sitting, Cuff Size: Normal)   Pulse (!) 58   Ht 5\' 7"  (1.702 m)   Wt 117 lb (53.1 kg)   SpO2 97%   BMI 18.32 kg/m     Wt Readings from Last 3 Encounters:  02/06/21 117 lb (53.1 kg)  07/02/20 125 lb (56.7 kg)  05/08/19 132 lb (59.9 kg)     GEN:  Well nourished, well developed in no acute distress HEENT: Normal NECK: No JVD; No carotid bruits LYMPHATICS: No lymphadenopathy CARDIAC: RRR, no murmurs, no rubs, no gallops RESPIRATORY:  Clear to auscultation without rales, wheezing or rhonchi  ABDOMEN: Soft, non-tender, non-distended MUSCULOSKELETAL:  No edema; No deformity  SKIN: Warm and dry LOWER EXTREMITIES: no swelling NEUROLOGIC:  Alert and oriented x 3 PSYCHIATRIC:  Normal affect   ASSESSMENT:    1. Sinus bradycardia   2. Essential hypertension   3. Palpitations   4. Mixed hyperlipidemia    PLAN:    In order of problems listed above:  Sinus  bradycardia asymptomatic we will continue present management we will avoiding sinus node suppressing agent. Essential hypertension slightly elevated today I initiated conversation about potentially using some medication but she is not interested. Palpitations denies having any Mixed dyslipidemia I did review her K PN which show me LDL 132 HDL of 60 again I initiated discussion about potentially taking medication she says she does not want to do that she promised me to be a few more active and watch more carefully what she eats.  This  is the typical discussion that I have with her multiple times already discussed this issue every single time she simply refuse medications   Medication Adjustments/Labs and Tests Ordered: Current medicines are reviewed at length with the patient today.  Concerns regarding medicines are outlined above.  No orders of the defined types were placed in this encounter.  Medication changes: No orders of the defined types were placed in this encounter.   Signed, Monique Lea, MD, [facility] 02/06/2021 1:05 PM     [facility]

## 2021-09-04 ENCOUNTER — Ambulatory Visit: Payer: Medicare Other | Admitting: Cardiology

## 2021-12-24 ENCOUNTER — Ambulatory Visit: Payer: Medicare Other | Admitting: Sports Medicine

## 2022-01-14 ENCOUNTER — Encounter: Payer: Self-pay | Admitting: Podiatry

## 2022-01-14 ENCOUNTER — Ambulatory Visit (INDEPENDENT_AMBULATORY_CARE_PROVIDER_SITE_OTHER): Payer: Medicare Other | Admitting: Podiatry

## 2022-01-14 ENCOUNTER — Ambulatory Visit (INDEPENDENT_AMBULATORY_CARE_PROVIDER_SITE_OTHER): Payer: Medicare Other

## 2022-01-14 DIAGNOSIS — Q828 Other specified congenital malformations of skin: Secondary | ICD-10-CM

## 2022-01-14 DIAGNOSIS — M21621 Bunionette of right foot: Secondary | ICD-10-CM

## 2022-01-14 DIAGNOSIS — M79671 Pain in right foot: Secondary | ICD-10-CM

## 2022-01-14 NOTE — Progress Notes (Signed)
  Subjective:  Patient ID: Monique Lane, female    DOB: 11-Jul-1946,   MRN: [id number]  No chief complaint on file.   76 y.o. female presents for concern for bump on fifth toe. Relates it started about a month ago. Relates painful to walk.   . Denies any other pedal complaints. Denies n/v/f/c.   Past Medical History:  Diagnosis Date   Anxiety 04/18/2015   Last Assessment & Plan:  There is anxiety present here today but not as bad as I have seen before with her she is more anxious over [place] discussing her memory and what would need to be done in examining that   Arthritis    B-complex deficiency 09/04/2015   Chronic rhinitis 09/24/2015   Dyslipidemia 04/18/2015   Essential hypertension 04/18/2015   Last Assessment & Plan:  She is much better with her norvasc she is tolerating this well and the dose is agreeing with her and she is not having any issue with edema for this either so will continue with it.   Fatigue 09/04/2015   Iron deficiency anemia, unspecified 09/04/2015   Memory change 03/16/2016   Last Assessment & Plan:  She is obviously upset over the discussion about her memory and we discussed that she has had a change in her memory from the beginning of the year when her last one was done, and she scored 26/30 with some difficulty drawing the clock as well as she had difficulty with the three words and she wanted to have some help with remembering the first word which was not given to    Menopausal disorder 09/04/2015   Mixed hyperlipidemia 09/24/2015   Palpitations 10/25/2015   Peptic ulcer disease 10/25/2015   Primary insomnia 10/25/2015   Primary osteoarthritis involving multiple joints 03/03/2016   Last Assessment & Plan:  Discussed in depth with her and her husband she should try taking just the tylenol and avoid the pain med with the narcotic component , and she should see if that helps her as the husband was concerned about her memory and this impairing which I dont disagree with. She was lucid  here today and could answer questions appropriately.   Recurrent major depression (HCC) 09/04/2015   Sinus bradycardia 04/18/2015   Ulcer    Vitamin B12 deficiency 09/24/2015    Objective:  Physical Exam: Vascular: DP/PT pulses 2/4 bilateral. CFT <3 seconds. Normal hair growth on digits. No edema.  Skin. No lacerations or abrasions bilateral feet. Hyperkeratotic cored lesion to left fifth metatarsal.  Musculoskeletal: MMT 5/5 bilateral lower extremities in DF, PF, Inversion and Eversion. Deceased ROM in DF of ankle joint.  Neurological: Sensation intact to light touch.   Assessment:   1. Porokeratosis      Plan:  Patient was evaluated and treated and all questions answered. -Discussed corns and calluses with patient and treatment options.  -Hyperkeratotic tissue was debrided with chisel without incident as courtesy today. X-rays reviewed and tailors bunion noted.   -Applied salycylic acid treatment to area with dressing. Advised to remove bandaging tomorrow.  -Encouraged daily moisturizing -Discussed use of pumice stone -Advised good supportive shoes and inserts -Patient to return to office as needed or sooner if condition worsens.   Louann Sjogren, DPM

## 2022-01-21 ENCOUNTER — Other Ambulatory Visit: Payer: Self-pay | Admitting: Podiatry

## 2022-01-21 DIAGNOSIS — M21621 Bunionette of right foot: Secondary | ICD-10-CM

## 2022-12-17 DIAGNOSIS — R079 Chest pain, unspecified: Secondary | ICD-10-CM

## 2023-03-21 DIAGNOSIS — R55 Syncope and collapse: Secondary | ICD-10-CM

## 2023-03-21 DIAGNOSIS — F0390 Unspecified dementia without behavioral disturbance: Secondary | ICD-10-CM

## 2023-03-21 DIAGNOSIS — R001 Bradycardia, unspecified: Secondary | ICD-10-CM

## 2023-03-22 ENCOUNTER — Other Ambulatory Visit: Payer: Self-pay

## 2023-03-22 DIAGNOSIS — R079 Chest pain, unspecified: Secondary | ICD-10-CM

## 2023-03-22 DIAGNOSIS — R001 Bradycardia, unspecified: Secondary | ICD-10-CM

## 2023-03-22 DIAGNOSIS — F0390 Unspecified dementia without behavioral disturbance: Secondary | ICD-10-CM

## 2023-03-22 DIAGNOSIS — R55 Syncope and collapse: Secondary | ICD-10-CM

## 2023-03-23 ENCOUNTER — Telehealth: Payer: Self-pay | Admitting: Cardiology

## 2023-03-23 NOTE — Telephone Encounter (Signed)
Monique Lane, patient's daughter called stating her mother was recently at [facility] and they suggested she wear a holter monitor and follow up with [place].  She does not want to go that route.  She is going to put her mother on palliative care.  She also stated that she has POA paperwork, and that her aunt (patient's sister) called our office yesterday and someone told her that she didn't have an appt with out office.  She would like for no information to be given to other family members.

## 2023-03-23 NOTE — Telephone Encounter (Signed)
Spoke with Burna Mortimer and advised that she is not on the [facility] for [place] to discuss pt's care. Burna Mortimer states that she will bring the [facility] paperwork as soon as she has time. She request that no information be given to Evalee Mutton either. Burna Mortimer states that her mom is going on Hospice for alzheimers.

## 2023-03-31 ENCOUNTER — Encounter: Payer: Self-pay | Admitting: Cardiology

## 2023-12-23 DEATH — deceased
